# Patient Record
Sex: Female | Born: 2000 | Race: Black or African American | Hispanic: No | Marital: Single | Smoking: Never smoker
Health system: Southern US, Community
[De-identification: ages and names within clinical notes are randomized; demographics above are authoritative.]

## PROBLEM LIST (undated history)

## (undated) DIAGNOSIS — L509 Urticaria, unspecified: Secondary | ICD-10-CM

## (undated) DIAGNOSIS — S32009A Unspecified fracture of unspecified lumbar vertebra, initial encounter for closed fracture: Secondary | ICD-10-CM

## (undated) HISTORY — DX: Urticaria, unspecified: L50.9

## (undated) HISTORY — PX: INDUCED ABORTION: SHX677

---
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
## 2020-02-24 ENCOUNTER — Ambulatory Visit: Payer: Medicaid Other | Attending: Internal Medicine

## 2020-02-24 ENCOUNTER — Other Ambulatory Visit: Payer: Self-pay

## 2020-02-24 DIAGNOSIS — Z20822 Contact with and (suspected) exposure to covid-19: Secondary | ICD-10-CM

## 2020-02-25 LAB — SARS-COV-2, NAA 2 DAY TAT

## 2020-02-25 LAB — NOVEL CORONAVIRUS, NAA: SARS-CoV-2, NAA: NOT DETECTED

## 2020-02-26 ENCOUNTER — Other Ambulatory Visit: Payer: Self-pay

## 2020-02-26 ENCOUNTER — Ambulatory Visit
Admission: EM | Admit: 2020-02-26 | Discharge: 2020-02-26 | Disposition: A | Discharge status: Home / Self Care | Payer: Medicaid Other

## 2020-02-26 DIAGNOSIS — J302 Other seasonal allergic rhinitis: Secondary | ICD-10-CM | POA: Diagnosis not present

## 2020-02-26 LAB — POCT MONO SCREEN (KUC): Mono, POC: NEGATIVE

## 2020-02-26 MED ORDER — BENZONATATE 100 MG PO CAPS: 100.0000 mg | ORAL_CAPSULE | Freq: Three times a day (TID) | ORAL | 0 refills | Status: DC

## 2020-02-26 MED ORDER — FLUTICASONE PROPIONATE 50 MCG/ACT NA SUSP: 1.0000 | Freq: Every day | NASAL | 0 refills | Status: DC

## 2020-02-26 MED ORDER — CETIRIZINE HCL 10 MG PO TABS: 10.0000 mg | ORAL_TABLET | Freq: Every day | ORAL | 0 refills | Status: DC

## 2020-02-26 NOTE — ED Triage Notes (Signed)
Pt started having sore throat and nasal congestion 2 weeks ago. Strep throat has been completed and it was negative. COVID test 2 days ago negative.  Pt was put on amoxicillin, didn't realize she was suppose to take 2 a day.  Was only taking one a day.  Has about 2 days left on antibiotics. No fevers.

## 2020-02-26 NOTE — Discharge Instructions (Addendum)
Zyrtec, Flonase, and Tessalon Perles were prescribed Advised to continue to take and complete amoxicillin course Follow-up with PCP Return or go to ED for worsening of symptoms

## 2020-02-26 NOTE — ED Provider Notes (Signed)
[facility]    CSN: [id number] Arrival date & time: 02/26/20  1115      History   Chief Complaint Chief Complaint  Patient presents with  . Nasal Congestion    HPI Brianna Patel is a 19 y.o. female.   Who presented to the urgent care with a complaint of nasal congestion, and sore throat for the past 2 weeks.  Reports she went to another urgent care in [place] and was treated with amoxicillin and prednisone.  Reported she tested negative for COVID-19  2 days ago. Denies recent travel.  Denies aggravating or alleviating symptoms.  Denies previous COVID infection.   Denies fever, chills, fatigue, rhinorrhea, cough, SOB, wheezing, chest pain, nausea, vomiting, changes in bowel or bladder habits.    The history is provided by the patient. No language interpreter was used.    Past Medical History:  Diagnosis Date  . Closed lumbar vertebral fracture (HCC)     There are no problems to display for this patient.   No past surgical history on file.  OB History   No obstetric history on file.      Home Medications    Prior to Admission medications   Medication Sig Start Date End Date Taking? Authorizing Provider  amoxicillin (AMOXIL) 500 MG capsule Take 500 mg by mouth 2 (two) times daily. 02/10/20   [provider]  benzonatate (TESSALON) 100 MG capsule Take 1 capsule (100 mg total) by mouth every 8 (eight) hours. 02/26/20   Shalunda Lindh, Darrelyn Hillock, FNP  cetirizine (ZYRTEC ALLERGY) 10 MG tablet Take 1 tablet (10 mg total) by mouth daily. 02/26/20   Albin Duckett, Darrelyn Hillock, FNP  fluticasone (FLONASE) 50 MCG/ACT nasal spray Place 1 spray into both nostrils daily for 14 days. 02/26/20 03/11/20  Camauri Craton, Darrelyn Hillock, FNP  ibuprofen (ADVIL,MOTRIN) 600 MG tablet Take 1 tablet (600 mg total) every 6 (six) hours as needed by mouth. 09/25/17   Julianne Rice, MD    Family History No family history on file.  Social History Social History   Tobacco Use  . Smoking status:  Never Smoker  . Smokeless tobacco: Never Used  Substance Use Topics  . Alcohol use: No    Comment: occasional  . Drug use: Yes    Types: Marijuana     Allergies   Patient has no known allergies.   Review of Systems Review of Systems  Constitutional: Negative.   HENT: Positive for congestion and sore throat.   Respiratory: Negative.   Cardiovascular: Negative.   Gastrointestinal: Negative.   Neurological: Negative.   All other systems reviewed and are negative.    Physical Exam Triage Vital Signs ED Triage Vitals [02/26/20 1128]  Enc Vitals Group     BP 117/73     Pulse Rate 91     Resp 16     Temp 98.2 F (36.8 C)     Temp Source Oral     SpO2 96 %     Weight      Height      Head Circumference      Peak Flow      Pain Score      Pain Loc      Pain Edu?      Excl. in [place]?    No data found.  Updated Vital Signs BP 117/73 (BP Location: Right Arm)   Pulse 91   Temp 98.2 F (36.8 C) (Oral)   Resp 16   LMP 02/16/2020  SpO2 96%   Visual Acuity Right Eye Distance:   Left Eye Distance:   Bilateral Distance:    Right Eye Near:   Left Eye Near:    Bilateral Near:     Physical Exam Vitals and nursing note reviewed.  Constitutional:      General: She is not in acute distress.    Appearance: Normal appearance. She is normal weight. She is not ill-appearing, toxic-appearing or diaphoretic.  HENT:     Head: Normocephalic.     Right Ear: Tympanic membrane, ear canal and external ear normal. There is no impacted cerumen.     Left Ear: Tympanic membrane, ear canal and external ear normal. There is no impacted cerumen.     Nose: Congestion and rhinorrhea present.     Mouth/Throat:     Lips: Pink.     Mouth: Mucous membranes are moist.     Dentition: Normal dentition.     Tongue: No lesions.     Palate: No mass.     Pharynx: Oropharynx is clear.     Tonsils: 2+ on the right. 2+ on the left.  Cardiovascular:     Rate and Rhythm: Normal rate and regular  rhythm.     Pulses: Normal pulses.     Heart sounds: Normal heart sounds. No murmur. No friction rub. No gallop.   Pulmonary:     Effort: Pulmonary effort is normal. No respiratory distress.     Breath sounds: Normal breath sounds. No stridor. No wheezing, rhonchi or rales.  Chest:     Chest wall: No tenderness.  Neurological:     Mental Status: She is alert.      UC Treatments / Results  Labs (all labs ordered are listed, but only abnormal results are displayed) Labs Reviewed  POCT MONO SCREEN [facility])    EKG   Radiology No results found.  Procedures Procedures (including critical care time)  Medications Ordered in UC Medications - No data to display  Initial Impression / Assessment and Plan / UC Course  I have reviewed the triage vital signs and the nursing notes.  Pertinent labs & imaging results that were available during my care of the patient were reviewed by me and considered in my medical decision making (see chart for details).    Patient is stable at discharge. POCT mono was negative Patient was advised to continue to take and complete amoxicillin Zyrtec, Tessalon Perles, and Flonase were prescribed Was advised to follow-up with PCP Return or go to ED for worsening symptoms  Final Clinical Impressions(s) / UC Diagnoses   Final diagnoses:  Seasonal allergic reaction     Discharge Instructions     Zyrtec, Flonase, and Tessalon Perles were prescribed Advised to continue to take and complete amoxicillin course Follow-up with PCP Return or go to ED for worsening of symptoms    ED Prescriptions    Medication Sig Dispense Auth. Provider   cetirizine (ZYRTEC ALLERGY) 10 MG tablet Take 1 tablet (10 mg total) by mouth daily. 30 tablet Davanee Klinkner S, FNP   fluticasone (FLONASE) 50 MCG/ACT nasal spray Place 1 spray into both nostrils daily for 14 days. 16 g Justin Meisenheimer S, FNP   benzonatate (TESSALON) 100 MG capsule Take 1 capsule (100 mg total)  by mouth every 8 (eight) hours. 30 capsule Mailee Klaas, Zachery Dakins, FNP     PDMP not reviewed this encounter.   Durward Parcel, FNP 02/26/20 1220

## 2020-03-06 ENCOUNTER — Ambulatory Visit
Admission: EM | Admit: 2020-03-06 | Discharge: 2020-03-06 | Disposition: A | Discharge status: Home / Self Care | Payer: Medicaid Other | Attending: Emergency Medicine | Admitting: Emergency Medicine

## 2020-03-06 DIAGNOSIS — N898 Other specified noninflammatory disorders of vagina: Secondary | ICD-10-CM | POA: Diagnosis not present

## 2020-03-06 DIAGNOSIS — Z113 Encounter for screening for infections with a predominantly sexual mode of transmission: Secondary | ICD-10-CM | POA: Diagnosis not present

## 2020-03-06 MED ORDER — FLUCONAZOLE 150 MG PO TABS: 150.0000 mg | ORAL_TABLET | Freq: Every day | ORAL | 0 refills | Status: DC

## 2020-03-06 NOTE — ED Triage Notes (Signed)
Pt presents with c/o vaginal itching for past few days

## 2020-03-06 NOTE — ED Provider Notes (Signed)
[place]   [id number] 03/06/20 Arrival Time: 2423   NT:[id number] itching SUBJECTIVE:  [place] is a 19 y.o. female who presents to the urgent care with a complaint of vaginal itching for the past few days.  She denies a precipitating event, recent sexual encounter.  Has recently finished antibiotic course.  Patient is sexually active with 1 female partners.  Has not tried any OTC medication.  Denies similar symptoms in the past.  She denies fever, chills, nausea, vomiting, abdominal or pelvic pain, urinary symptoms, vaginal itching, vaginal odor, vaginal bleeding, dyspareunia, vaginal rashes or lesions.   Patient's last menstrual period was 02/16/2020. Current birth control method: Compliant with BC:  ROS: As per HPI.  All other pertinent ROS negative.     Past Medical History:  Diagnosis Date  . Closed lumbar vertebral fracture ([place])    History reviewed. No pertinent surgical history. No Known Allergies No current facility-administered medications on file prior to encounter.   Current Outpatient Medications on File Prior to Encounter  Medication Sig Dispense Refill  . amoxicillin (AMOXIL) 500 MG capsule Take 500 mg by mouth 2 (two) times daily.    . benzonatate (TESSALON) 100 MG capsule Take 1 capsule (100 mg total) by mouth every 8 (eight) hours. 30 capsule 0  . cetirizine (ZYRTEC ALLERGY) 10 MG tablet Take 1 tablet (10 mg total) by mouth daily. 30 tablet 0  . fluticasone (FLONASE) 50 MCG/ACT nasal spray Place 1 spray into both nostrils daily for 14 days. 16 g 0  . ibuprofen (ADVIL,MOTRIN) 600 MG tablet Take 1 tablet (600 mg total) every 6 (six) hours as needed by mouth. 30 tablet 0    Social History   Socioeconomic History  . Marital status: Single    Spouse name: Not on file  . Number of children: Not on file  . Years of education: Not on file  . Highest education level: Not on file  Occupational History  . Not on file  Tobacco Use  . Smoking status:  Never Smoker  . Smokeless tobacco: Never Used  Substance and Sexual Activity  . Alcohol use: No    Comment: occasional  . Drug use: Yes    Types: Marijuana  . Sexual activity: Yes    Birth control/protection: Implant  Other Topics Concern  . Not on file  Social History Narrative  . Not on file   Social Determinants of Health   Financial Resource Strain:   . Difficulty of Paying Living Expenses:   Food Insecurity:   . Worried About Charity fundraiser in the Last Year:   . Arboriculturist in the Last Year:   Transportation Needs:   . Film/video editor (Medical):   Marland Kitchen Lack of Transportation (Non-Medical):   Physical Activity:   . Days of Exercise per Week:   . Minutes of Exercise per Session:   Stress:   . Feeling of Stress :   Social Connections:   . Frequency of Communication with Friends and Family:   . Frequency of Social Gatherings with Friends and Family:   . Attends Religious Services:   . Active Member of Clubs or Organizations:   . Attends Archivist Meetings:   Marland Kitchen Marital Status:   Intimate Partner Violence:   . Fear of Current or Ex-Partner:   . Emotionally Abused:   Marland Kitchen Physically Abused:   . Sexually Abused:    History reviewed. No pertinent family history.  OBJECTIVE:  Vitals:  03/06/20 1600  BP: 135/87  Pulse: 89  Resp: 16  Temp: 98.2 F (36.8 C)  TempSrc: Oral  SpO2: 98%     General appearance: Alert, NAD, appears stated age Head: NCAT Throat: lips, mucosa, and tongue normal; teeth and gums normal Lungs: CTA bilaterally without adventitious breath sounds Heart: regular rate and rhythm.  Radial pulses 2+ symmetrical bilaterally Back: no CVA tenderness Abdomen: soft, non-tender; bowel sounds normal; no masses or organomegaly; no guarding or rebound tenderness GU:  Vaginal self swab obtained Skin: warm and dry Psychological:  Alert and cooperative. Normal mood and affect.  LABS:  Results for orders placed or performed during  the hospital encounter of 02/26/20  POCT mono screen  Result Value Ref Range   Mono, POC Negative Negative    Labs Reviewed  CERVICOVAGINAL ANCILLARY ONLY    ASSESSMENT & PLAN:  1. Vaginal itching   2. Screening for STD (sexually transmitted disease)     Meds ordered this encounter  Medications  . fluconazole (DIFLUCAN) 150 MG tablet    Sig: Take 1 tablet (150 mg total) by mouth daily. Prescribed diflucan 100 mg once daily and then second dose 72 hours late    Dispense:  2 tablet    Refill:  0    Pending: Labs Reviewed  CERVICOVAGINAL ANCILLARY ONLY    Vaginal self-swab obtained.  We will follow up with you regarding abnormal results Prescribed diflucan 150 mg once daily and then second dose 72 hours later Take medications as prescribed and to completion If tests results are positive, please abstain from sexual activity until you and your partner(s) have been treated Follow up with PCP or [facility] if symptoms persists Return here or go to ER if you have any new or worsening symptoms fever, chills, nausea, vomiting, abdominal or pelvic pain, painful intercourse, vaginal discharge, vaginal bleeding, persistent symptoms despite treatment, etc...  Reviewed expectations re: course of current medical issues. Questions answered. Outlined signs and symptoms indicating need for more acute intervention. Patient verbalized understanding. After Visit Summary given.       Durward Parcel, FNP 03/06/20 1701

## 2020-03-06 NOTE — Discharge Instructions (Addendum)
Vaginal self-swab obtained.  We will follow up with you regarding abnormal results Prescribed diflucan 150 mg once daily and then second dose 72 hours later Take medications as prescribed and to completion If tests results are positive, please abstain from sexual activity until you and your partner(s) have been treated Follow up with PCP or [facility] if symptoms persists Return here or go to ER if you have any new or worsening symptoms fever, chills, nausea, vomiting, abdominal or pelvic pain, painful intercourse, vaginal discharge, vaginal bleeding, persistent symptoms despite treatment, etc... 

## 2020-03-07 ENCOUNTER — Telehealth (HOSPITAL_COMMUNITY): Payer: Self-pay

## 2020-03-07 LAB — CERVICOVAGINAL ANCILLARY ONLY
Bacterial Vaginitis (gardnerella): POSITIVE — AB
Candida Glabrata: NEGATIVE
Candida Vaginitis: NEGATIVE
Chlamydia: NEGATIVE
Comment: NEGATIVE
Comment: NEGATIVE
Comment: NEGATIVE
Comment: NEGATIVE
Comment: NEGATIVE
Comment: NORMAL
Neisseria Gonorrhea: NEGATIVE
Trichomonas: NEGATIVE

## 2020-03-07 MED ORDER — METRONIDAZOLE 500 MG PO TABS: 500.0000 mg | ORAL_TABLET | Freq: Two times a day (BID) | ORAL | 0 refills | Status: DC

## 2020-03-20 ENCOUNTER — Other Ambulatory Visit: Payer: Self-pay

## 2020-03-20 ENCOUNTER — Ambulatory Visit: Payer: Medicaid Other | Attending: Internal Medicine

## 2020-03-20 DIAGNOSIS — Z20822 Contact with and (suspected) exposure to covid-19: Secondary | ICD-10-CM

## 2020-03-21 LAB — NOVEL CORONAVIRUS, NAA: SARS-CoV-2, NAA: NOT DETECTED

## 2020-03-21 LAB — SARS-COV-2, NAA 2 DAY TAT

## 2020-06-05 ENCOUNTER — Encounter: Payer: Self-pay | Admitting: Emergency Medicine

## 2020-06-05 ENCOUNTER — Other Ambulatory Visit: Payer: Self-pay

## 2020-06-05 ENCOUNTER — Ambulatory Visit
Admission: EM | Admit: 2020-06-05 | Discharge: 2020-06-05 | Disposition: A | Discharge status: Home / Self Care | Payer: Medicaid Other

## 2020-06-05 DIAGNOSIS — M545 Low back pain, unspecified: Secondary | ICD-10-CM

## 2020-06-05 MED ORDER — CYCLOBENZAPRINE HCL 10 MG PO TABS: 10.0000 mg | ORAL_TABLET | Freq: Every day | ORAL | 0 refills | Status: DC

## 2020-06-05 MED ORDER — PREDNISONE 20 MG PO TABS: 20.0000 mg | ORAL_TABLET | Freq: Two times a day (BID) | ORAL | 0 refills | Status: AC

## 2020-06-05 NOTE — ED Triage Notes (Signed)
Lower back pain for 3 days.

## 2020-06-05 NOTE — ED Provider Notes (Signed)
[facility]   [id number] 06/05/20 Arrival Time: 1814  CC: Back PAIN  SUBJECTIVE: History from: patient. Terease A Gowens is a 19 y.o. female complains of low back pain x 3 days.  Denies a precipitating event or specific injury.  Localizes the pain to the bilateral low back.  Describes the pain as constant and sharp in character.  Has tried OTC medications without relief.  Symptoms are made worse with laying flat.  Denies similar symptoms in the past.  Denies fever, chills, erythema, ecchymosis, effusion, weakness, numbness and tingling, saddle paresthesias, loss of bowel or bladder function.      ROS: As per HPI.  All other pertinent ROS negative.     Past Medical History:  Diagnosis Date  . Closed lumbar vertebral fracture ([facility])    History reviewed. No pertinent surgical history. No Known Allergies No current facility-administered medications on file prior to encounter.   Current Outpatient Medications on File Prior to Encounter  Medication Sig Dispense Refill  . cetirizine (ZYRTEC ALLERGY) 10 MG tablet Take 1 tablet (10 mg total) by mouth daily. 30 tablet 0  . CRYSELLE-28 0.3-30 MG-MCG tablet Take 1 tablet by mouth daily.    . fluticasone (FLONASE) 50 MCG/ACT nasal spray Place 1 spray into both nostrils daily for 14 days. 16 g 0  . ibuprofen (ADVIL,MOTRIN) 600 MG tablet Take 1 tablet (600 mg total) every 6 (six) hours as needed by mouth. 30 tablet 0   Social History   Socioeconomic History  . Marital status: Single    Spouse name: Not on file  . Number of children: Not on file  . Years of education: Not on file  . Highest education level: Not on file  Occupational History  . Not on file  Tobacco Use  . Smoking status: Never Smoker  . Smokeless tobacco: Never Used  Vaping Use  . Vaping Use: Never used  Substance and Sexual Activity  . Alcohol use: No    Comment: occasional  . Drug use: Yes    Types: Marijuana  . Sexual activity: Yes    Birth  control/protection: Implant  Other Topics Concern  . Not on file  Social History Narrative  . Not on file   Social Determinants of Health   Financial Resource Strain:   . Difficulty of Paying Living Expenses:   Food Insecurity:   . Worried About Programme researcher, broadcasting/film/video in the Last Year:   . Barista in the Last Year:   Transportation Needs:   . Freight forwarder (Medical):   Marland Kitchen Lack of Transportation (Non-Medical):   Physical Activity:   . Days of Exercise per Week:   . Minutes of Exercise per Session:   Stress:   . Feeling of Stress :   Social Connections:   . Frequency of Communication with Friends and Family:   . Frequency of Social Gatherings with Friends and Family:   . Attends Religious Services:   . Active Member of Clubs or Organizations:   . Attends Banker Meetings:   Marland Kitchen Marital Status:   Intimate Partner Violence:   . Fear of Current or Ex-Partner:   . Emotionally Abused:   Marland Kitchen Physically Abused:   . Sexually Abused:    No family history on file.  OBJECTIVE:  Vitals:   06/05/20 1826 06/05/20 1827  BP: 120/73   Pulse: 88   Resp: 18   Temp: 98.7 F (37.1 C)   TempSrc: Oral  SpO2: 98%   Weight:  180 lb (81.6 kg)    General appearance: ALERT; in no acute distress.  Head: NCAT Lungs: Normal respiratory effort; CTAB CV: RRR Musculoskeletal: Back Inspection: Skin warm, dry, clear and intact without obvious erythema, effusion, or ecchymosis.  Palpation: Diffusely TTP over lumbar spine and lumbar paravertebral muscles ROM: FROM active and passive Strength: 5/5 shld abduction, 5/5 shld adduction, 5/5 elbow flexion, 5/5 elbow extension, 5/5 grip strength, 5/5 hip flexion, 5/5 hip extension Skin: warm and dry Neurologic: Ambulates without difficulty; Sensation intact about the upper/ lower extremities Psychological: alert and cooperative; normal mood and affect  ASSESSMENT & PLAN:  1. Acute bilateral low back pain without sciatica      Meds ordered this encounter  Medications  . predniSONE (DELTASONE) 20 MG tablet    Sig: Take 1 tablet (20 mg total) by mouth 2 (two) times daily with a meal for 5 days.    Dispense:  10 tablet    Refill:  0    Order Specific Question:   Supervising Provider    Answer:   Eustace Moore [[id number]]  . cyclobenzaprine (FLEXERIL) 10 MG tablet    Sig: Take 1 tablet (10 mg total) by mouth at bedtime.    Dispense:  15 tablet    Refill:  0    Order Specific Question:   Supervising Provider    Answer:   Eustace Moore [[id number]]    Continue conservative management of rest, ice, and gentle stretches Prednisone prescribed.  Take as directed and to completion Take cyclobenzaprine at nighttime for symptomatic relief. Avoid driving or operating heavy machinery while using medication. Follow up with PCP if symptoms persist Return or go to the ER if you have any new or worsening symptoms (fever, chills, chest pain, abdominal pain, changes in bowel or bladder habits, pain radiating into lower legs, etc...)   Reviewed expectations re: course of current medical issues. Questions answered. Outlined signs and symptoms indicating need for more acute intervention. Patient verbalized understanding. After Visit Summary given.    Rennis Harding, PA-C 06/05/20 1843

## 2020-06-05 NOTE — Discharge Instructions (Signed)
Continue conservative management of rest, ice, and gentle stretches Prednisone prescribed.  Take as directed and to completion Take cyclobenzaprine at nighttime for symptomatic relief. Avoid driving or operating heavy machinery while using medication. Follow up with PCP if symptoms persist Return or go to the ER if you have any new or worsening symptoms (fever, chills, chest pain, abdominal pain, changes in bowel or bladder habits, pain radiating into lower legs, etc...)  

## 2020-06-09 ENCOUNTER — Encounter: Payer: Self-pay | Admitting: Emergency Medicine

## 2020-06-09 ENCOUNTER — Ambulatory Visit
Admission: EM | Admit: 2020-06-09 | Discharge: 2020-06-09 | Disposition: A | Discharge status: Home / Self Care | Payer: Medicaid Other | Attending: Emergency Medicine | Admitting: Emergency Medicine

## 2020-06-09 DIAGNOSIS — Z20822 Contact with and (suspected) exposure to covid-19: Secondary | ICD-10-CM

## 2020-06-09 NOTE — ED Triage Notes (Signed)
covid exposure from a family member no symptoms

## 2020-06-09 NOTE — Discharge Instructions (Signed)

## 2020-06-09 NOTE — ED Provider Notes (Signed)
[facility]   [id number] 06/09/20 Arrival Time: 1235   CC: COVID exposure  SUBJECTIVE: History from: patient.  Oliver A Goga is a 19 y.o. female who presents for COVID testing.  COVID exposure today.  Denies recent travel.  Denies aggravating or alleviating symptoms.  Reports previous COVID infection in May.   Denies fever, chills, fatigue, nasal congestion, rhinorrhea, sore throat, cough, SOB, wheezing, chest pain, nausea, vomiting, changes in bowel or bladder habits.     ROS: As per HPI.  All other pertinent ROS negative.     Past Medical History:  Diagnosis Date  . Closed lumbar vertebral fracture ([facility])    History reviewed. No pertinent surgical history. No Known Allergies No current facility-administered medications on file prior to encounter.   Current Outpatient Medications on File Prior to Encounter  Medication Sig Dispense Refill  . cetirizine (ZYRTEC ALLERGY) 10 MG tablet Take 1 tablet (10 mg total) by mouth daily. 30 tablet 0  . CRYSELLE-28 0.3-30 MG-MCG tablet Take 1 tablet by mouth daily.    . cyclobenzaprine (FLEXERIL) 10 MG tablet Take 1 tablet (10 mg total) by mouth at bedtime. 15 tablet 0  . fluticasone (FLONASE) 50 MCG/ACT nasal spray Place 1 spray into both nostrils daily for 14 days. 16 g 0  . ibuprofen (ADVIL,MOTRIN) 600 MG tablet Take 1 tablet (600 mg total) every 6 (six) hours as needed by mouth. 30 tablet 0  . predniSONE (DELTASONE) 20 MG tablet Take 1 tablet (20 mg total) by mouth 2 (two) times daily with a meal for 5 days. 10 tablet 0   Social History   Socioeconomic History  . Marital status: Single    Spouse name: Not on file  . Number of children: Not on file  . Years of education: Not on file  . Highest education level: Not on file  Occupational History  . Not on file  Tobacco Use  . Smoking status: Never Smoker  . Smokeless tobacco: Never Used  Vaping Use  . Vaping Use: Never used  Substance and Sexual Activity  . Alcohol use:  No    Comment: occasional  . Drug use: Yes    Types: Marijuana  . Sexual activity: Yes    Birth control/protection: Implant  Other Topics Concern  . Not on file  Social History Narrative  . Not on file   Social Determinants of Health   Financial Resource Strain:   . Difficulty of Paying Living Expenses:   Food Insecurity:   . Worried About Programme researcher, broadcasting/film/video in the Last Year:   . Barista in the Last Year:   Transportation Needs:   . Freight forwarder (Medical):   Marland Kitchen Lack of Transportation (Non-Medical):   Physical Activity:   . Days of Exercise per Week:   . Minutes of Exercise per Session:   Stress:   . Feeling of Stress :   Social Connections:   . Frequency of Communication with Friends and Family:   . Frequency of Social Gatherings with Friends and Family:   . Attends Religious Services:   . Active Member of Clubs or Organizations:   . Attends Banker Meetings:   Marland Kitchen Marital Status:   Intimate Partner Violence:   . Fear of Current or Ex-Partner:   . Emotionally Abused:   Marland Kitchen Physically Abused:   . Sexually Abused:    History reviewed. No pertinent family history.  OBJECTIVE:  Vitals:   06/09/20 1246  BP:  103/66  Pulse: 75  Resp: 16  Temp: 98.2 F (36.8 C)  TempSrc: Oral  SpO2: 97%     General appearance: alert; well-appearing, nontoxic; speaking in full sentences and tolerating own secretions HEENT: NCAT; Ears: EACs clear, TMs pearly gray; Eyes: PERRL.  EOM grossly intact.Nose: nares patent without rhinorrhea, Throat: oropharynx clear, tonsils non erythematous or enlarged, uvula midline  Neck: supple without LAD Lungs: unlabored respirations, symmetrical air entry; cough: absent; no respiratory distress; CTAB Heart: regular rate and rhythm.  Skin: warm and dry Psychological: alert and cooperative; normal mood and affect  ASSESSMENT & PLAN:  1. Exposure to COVID-19 virus    COVID testing ordered.  It will take between 2-5 days  for test results.  Someone will contact you regarding abnormal results.    In the meantime:  If you were to develop symptoms: You should remain isolated in your home for 10 days from symptom onset AND greater than 72 hours after symptoms resolution (absence of fever without the use of fever-reducing medication and improvement in respiratory symptoms), whichever is longer If you have had exposure: you should remain in quarantine for 7 days from exposure.  However, if symptoms develop you must self isolate and return for retesting Get plenty of rest and push fluids Follow up with PCP as needed Call or go to the ED if you have any new symptoms such as fever, cough, shortness of breath, chest tightness, chest pain, turning blue, changes in mental status, etc...   Reviewed expectations re: course of current medical issues. Questions answered. Outlined signs and symptoms indicating need for more acute intervention. Patient verbalized understanding. After Visit Summary given.         Rennis Harding, PA-C 06/09/20 1258

## 2020-06-10 LAB — NOVEL CORONAVIRUS, NAA: SARS-CoV-2, NAA: NOT DETECTED

## 2020-06-10 LAB — SARS-COV-2, NAA 2 DAY TAT

## 2020-10-13 ENCOUNTER — Ambulatory Visit
Admission: RE | Admit: 2020-10-13 | Discharge: 2020-10-13 | Disposition: A | Discharge status: Home / Self Care | Payer: Medicaid Other | Source: Ambulatory Visit | Attending: Family Medicine | Admitting: Family Medicine

## 2020-10-13 ENCOUNTER — Other Ambulatory Visit: Payer: Self-pay

## 2020-10-13 VITALS — BP 110/69 | HR 84 | Temp 98.6°F | Resp 18 | Ht 63.0 in | Wt 182.0 lb

## 2020-10-13 DIAGNOSIS — Z113 Encounter for screening for infections with a predominantly sexual mode of transmission: Secondary | ICD-10-CM | POA: Diagnosis present

## 2020-10-13 DIAGNOSIS — N898 Other specified noninflammatory disorders of vagina: Secondary | ICD-10-CM | POA: Insufficient documentation

## 2020-10-13 NOTE — Discharge Instructions (Addendum)
Swab test will be back in about 3 days  Results will be available MyChart.  We will be in touch with you with abnormal results that require further treatment.  Abstain from sex for the next 7 days or until results are back and negative.  Follow up with this office or with primary care if symptoms are persisting.  Follow up in the ER for high fever, trouble swallowing, trouble breathing, other concerning symptoms.

## 2020-10-13 NOTE — ED Triage Notes (Addendum)
Pt had some brown vaginal discharge x 2days ago.  Pt is not supposed to start her cycle for another week.  Pt would like to be tested for std's.

## 2020-10-13 NOTE — ED Provider Notes (Signed)
[facility]   [id number] 10/13/20 Arrival Time: 1649   CC: VAGINAL DISCHARGE  SUBJECTIVE:  Brianna Patel is a 19 y.o. female who presents with complaints of abrupt vaginal discharge that began last week. Reports that it only lasted for 1 day. Reports that she is currently sexually active with female. Takes her birth control consistently. Declines pregnancy test today. Describes discharge as brown. Has not attempted OTC treatment.  Denies aggravating or alleviating factors. Denies similar symptoms in the past.  Denies fever, chills, nausea, vomiting, abdominal or pelvic pain, urinary symptoms, vaginal itching, vaginal odor, vaginal bleeding, dyspareunia, vaginal rashes or lesions.   Patient's last menstrual period was 09/24/2020. Current birth control method: pills  Compliant with BC: yes  ROS: As per HPI.  All other pertinent ROS negative.     Past Medical History:  Diagnosis Date  . Closed lumbar vertebral fracture ([facility])    History reviewed. No pertinent surgical history. No Known Allergies No current facility-administered medications on file prior to encounter.   Current Outpatient Medications on File Prior to Encounter  Medication Sig Dispense Refill  . cetirizine (ZYRTEC ALLERGY) 10 MG tablet Take 1 tablet (10 mg total) by mouth daily. 30 tablet 0  . CRYSELLE-28 0.3-30 MG-MCG tablet Take 1 tablet by mouth daily.    . cyclobenzaprine (FLEXERIL) 10 MG tablet Take 1 tablet (10 mg total) by mouth at bedtime. 15 tablet 0  . fluticasone (FLONASE) 50 MCG/ACT nasal spray Place 1 spray into both nostrils daily for 14 days. 16 g 0  . ibuprofen (ADVIL,MOTRIN) 600 MG tablet Take 1 tablet (600 mg total) every 6 (six) hours as needed by mouth. 30 tablet 0    Social History   Socioeconomic History  . Marital status: Single    Spouse name: Not on file  . Number of children: Not on file  . Years of education: Not on file  . Highest education level: Not on file  Occupational  History  . Not on file  Tobacco Use  . Smoking status: Never Smoker  . Smokeless tobacco: Never Used  Vaping Use  . Vaping Use: Never used  Substance and Sexual Activity  . Alcohol use: No    Comment: occasional  . Drug use: Not Currently    Types: Marijuana  . Sexual activity: Yes    Birth control/protection: Implant  Other Topics Concern  . Not on file  Social History Narrative  . Not on file   Social Determinants of Health   Financial Resource Strain:   . Difficulty of Paying Living Expenses: Not on file  Food Insecurity:   . Worried About Programme researcher, broadcasting/film/video in the Last Year: Not on file  . Ran Out of Food in the Last Year: Not on file  Transportation Needs:   . Lack of Transportation (Medical): Not on file  . Lack of Transportation (Non-Medical): Not on file  Physical Activity:   . Days of Exercise per Week: Not on file  . Minutes of Exercise per Session: Not on file  Stress:   . Feeling of Stress : Not on file  Social Connections:   . Frequency of Communication with Friends and Family: Not on file  . Frequency of Social Gatherings with Friends and Family: Not on file  . Attends Religious Services: Not on file  . Active Member of Clubs or Organizations: Not on file  . Attends Banker Meetings: Not on file  . Marital Status: Not on file  Intimate Partner Violence:   . Fear of Current or Ex-Partner: Not on file  . Emotionally Abused: Not on file  . Physically Abused: Not on file  . Sexually Abused: Not on file   No family history on file.  OBJECTIVE:  Vitals:   10/13/20 1721 10/13/20 1722  BP: 110/69   Pulse: 84   Resp: 18   Temp: 98.6 F (37 C)   TempSrc: Oral   SpO2: 98%   Weight:  182 lb (82.6 kg)  Height:  5\' 3"  (1.6 m)     General appearance: Alert, NAD, appears stated age Head: NCAT Throat: lips, mucosa, and tongue normal; teeth and gums normal Lungs: CTA bilaterally without adventitious breath sounds Heart: regular rate and  rhythm.  Radial pulses 2+ symmetrical bilaterally Back: no CVA tenderness Abdomen: soft, non-tender; bowel sounds normal; no masses or organomegaly; no guarding or rebound tenderness GU: declines  Skin: warm and dry Psychological:  Alert and cooperative. Normal mood and affect.  LABS:  Results for orders placed or performed during the hospital encounter of 06/09/20  Novel Coronavirus, NAA ([facility])   Specimen: Nasopharyngeal(NP) swabs in vial transport medium   Nasopharynge  Patient  Result Value Ref Range   SARS-CoV-2, NAA Not Detected Not Detected  SARS-COV-2, NAA 2 DAY TAT   Nasopharynge  Patient  Result Value Ref Range   SARS-CoV-2, NAA 2 DAY TAT Performed     Labs Reviewed  CERVICOVAGINAL ANCILLARY ONLY    ASSESSMENT & PLAN:  1. Screening for STD (sexually transmitted disease)   2. Vaginal discharge     No orders of the defined types were placed in this encounter.   Pending: Labs Reviewed  CERVICOVAGINAL ANCILLARY ONLY    Cytology self-swab obtained.   We will follow up with you regarding abnormal results If tests results are positive, please abstain from sexual activity until you and your partner(s) have been treated Follow up with PCP or [facility] if symptoms persists Return here or go to ER if you have any new or worsening symptoms fever, chills, nausea, vomiting, abdominal or pelvic pain, painful intercourse, persistent symptoms despite treatment Reviewed expectations re: course of current medical issues. Questions answered. Outlined signs and symptoms indicating need for more acute intervention. Patient verbalized understanding. After Visit Summary given.       06/11/20, NP 10/13/20 1745

## 2020-10-16 LAB — CERVICOVAGINAL ANCILLARY ONLY
Bacterial Vaginitis (gardnerella): NEGATIVE
Candida Glabrata: NEGATIVE
Candida Vaginitis: NEGATIVE
Chlamydia: NEGATIVE
Comment: NEGATIVE
Comment: NEGATIVE
Comment: NEGATIVE
Comment: NEGATIVE
Comment: NEGATIVE
Comment: NORMAL
Neisseria Gonorrhea: NEGATIVE
Trichomonas: NEGATIVE

## 2020-11-02 ENCOUNTER — Ambulatory Visit
Admission: EM | Admit: 2020-11-02 | Discharge: 2020-11-02 | Disposition: A | Discharge status: Home / Self Care | Payer: Medicaid Other | Attending: Internal Medicine | Admitting: Internal Medicine

## 2020-11-02 ENCOUNTER — Other Ambulatory Visit: Payer: Self-pay

## 2020-11-02 DIAGNOSIS — J029 Acute pharyngitis, unspecified: Secondary | ICD-10-CM | POA: Diagnosis present

## 2020-11-02 LAB — POCT RAPID STREP A (OFFICE): Rapid Strep A Screen: NEGATIVE

## 2020-11-02 NOTE — ED Triage Notes (Signed)
Pt presents with c/o nasal congestion for past couple of weeks, vomited yesterday

## 2020-11-02 NOTE — Discharge Instructions (Signed)
Warm salt water gargle Continue the Flonase and allergy medications given by your primary care doctor If you have worsening symptoms follow-up with primary care physician.

## 2020-11-02 NOTE — ED Provider Notes (Signed)
[facility]    CSN: [id number] Arrival date & time: 11/02/20  1611      History   Chief Complaint Chief Complaint  Patient presents with  . Nasal Congestion    HPI Brianna Patel is a 19 y.o. female comes to the urgent care with a 2-week history of nasal congestion, sore throat and an episode of emesis yesterday.  Patient denies any fever or chills.  No body aches.  No diarrhea.  No dizziness.  Patient is not vaccinated against COVID-19 virus.  She tested negative for COVID-19 earlier this week.   Patient was started on Flonase and Zyrtec earlier in the week.  She has been taking that consistently.  HPI  Past Medical History:  Diagnosis Date  . Closed lumbar vertebral fracture (HCC)     There are no problems to display for this patient.   History reviewed. No pertinent surgical history.  OB History   No obstetric history on file.      Home Medications    Prior to Admission medications   Medication Sig Start Date End Date Taking? Authorizing Provider  cetirizine (ZYRTEC ALLERGY) 10 MG tablet Take 1 tablet (10 mg total) by mouth daily. 02/26/20   Avegno, Zachery Dakins, FNP  CRYSELLE-28 0.3-30 MG-MCG tablet Take 1 tablet by mouth daily. 04/04/20   [provider]  cyclobenzaprine (FLEXERIL) 10 MG tablet Take 1 tablet (10 mg total) by mouth at bedtime. 06/05/20   Wurst, [place], PA-C  fluticasone (FLONASE) 50 MCG/ACT nasal spray Place 1 spray into both nostrils daily for 14 days. 02/26/20 03/11/20  Avegno, Zachery Dakins, FNP  ibuprofen (ADVIL,MOTRIN) 600 MG tablet Take 1 tablet (600 mg total) every 6 (six) hours as needed by mouth. 09/25/17   Loren Racer, MD    Family History History reviewed. No pertinent family history.  Social History Social History   Tobacco Use  . Smoking status: Never Smoker  . Smokeless tobacco: Never Used  Vaping Use  . Vaping Use: Never used  Substance Use Topics  . Alcohol use: No    Comment: occasional  . Drug use:  Not Currently    Types: Marijuana     Allergies   Patient has no known allergies.   Review of Systems Review of Systems  Eyes: Negative.   Respiratory: Negative.   Genitourinary: Negative.   Neurological: Negative.      Physical Exam Triage Vital Signs ED Triage Vitals  Enc Vitals Group     BP 11/02/20 1706 106/61     Pulse Rate 11/02/20 1706 84     Resp 11/02/20 1706 16     Temp 11/02/20 1706 97.6 F (36.4 C)     Temp Source 11/02/20 1706 Tympanic     SpO2 11/02/20 1706 96 %     Weight --      Height --      Head Circumference --      Peak Flow --      Pain Score 11/02/20 1718 0     Pain Loc --      Pain Edu? --      Excl. in GC? --    No data found.  Updated Vital Signs BP 106/61 (BP Location: Right Arm)   Pulse 84   Temp 97.6 F (36.4 C) (Tympanic)   Resp 16   LMP  (LMP Unknown)   SpO2 96%   Visual Acuity Right Eye Distance:   Left Eye Distance:   Bilateral Distance:  Right Eye Near:   Left Eye Near:    Bilateral Near:     Physical Exam HENT:     Right Ear: Tympanic membrane normal.     Left Ear: Tympanic membrane normal.     Nose: Nose normal.     Mouth/Throat:     Mouth: Mucous membranes are moist.     Pharynx: No posterior oropharyngeal erythema.  Neck:     Comments: Tonsils are 2+ bilaterally with no exudates. Musculoskeletal:     Cervical back: Normal range of motion. No rigidity or tenderness.  Lymphadenopathy:     Cervical: No cervical adenopathy.      UC Treatments / Results  Labs (all labs ordered are listed, but only abnormal results are displayed) Labs Reviewed  CULTURE, GROUP A STREP [facility])  POCT RAPID STREP A (OFFICE)    EKG   Radiology No results found.  Procedures Procedures (including critical care time)  Medications Ordered in UC Medications - No data to display  Initial Impression / Assessment and Plan / UC Course  I have reviewed the triage vital signs and the nursing notes.  Pertinent labs &  imaging results that were available during my care of the patient were reviewed by me and considered in my medical decision making (see chart for details).     1.  Allergic pharyngitis: Point-of-care strep is negative Throat culture sent Warm salt water gargle Continue taking cetirizine and Flonase I encourage you to consider taking the COVID-19 vaccine Final Clinical Impressions(s) / UC Diagnoses   Final diagnoses:  Allergic pharyngitis     Discharge Instructions     Warm salt water gargle Continue the Flonase and allergy medications given by your primary care doctor If you have worsening symptoms follow-up with primary care physician.   ED Prescriptions    None     PDMP not reviewed this encounter.   Merrilee Jansky, MD 11/02/20 1754

## 2020-11-05 LAB — CULTURE, GROUP A STREP (THRC)

## 2020-11-25 ENCOUNTER — Other Ambulatory Visit: Payer: Self-pay

## 2020-11-25 ENCOUNTER — Other Ambulatory Visit: Payer: Medicaid Other

## 2020-11-25 DIAGNOSIS — Z20822 Contact with and (suspected) exposure to covid-19: Secondary | ICD-10-CM

## 2020-11-30 LAB — SPECIMEN STATUS REPORT

## 2020-11-30 LAB — NOVEL CORONAVIRUS, NAA: SARS-CoV-2, NAA: NOT DETECTED

## 2021-03-04 ENCOUNTER — Other Ambulatory Visit: Payer: Self-pay

## 2021-03-04 ENCOUNTER — Emergency Department (HOSPITAL_COMMUNITY): Payer: Medicaid Other

## 2021-03-04 ENCOUNTER — Encounter (HOSPITAL_COMMUNITY): Payer: Self-pay | Admitting: Emergency Medicine

## 2021-03-04 ENCOUNTER — Emergency Department (HOSPITAL_COMMUNITY)
Admission: EM | Admit: 2021-03-04 | Discharge: 2021-03-04 | Disposition: A | Discharge status: Home / Self Care | Payer: Medicaid Other | Attending: Emergency Medicine | Admitting: Emergency Medicine

## 2021-03-04 DIAGNOSIS — S62346A Nondisplaced fracture of base of fifth metacarpal bone, right hand, initial encounter for closed fracture: Secondary | ICD-10-CM | POA: Diagnosis not present

## 2021-03-04 DIAGNOSIS — R52 Pain, unspecified: Secondary | ICD-10-CM

## 2021-03-04 DIAGNOSIS — W2209XA Striking against other stationary object, initial encounter: Secondary | ICD-10-CM | POA: Insufficient documentation

## 2021-03-04 DIAGNOSIS — Y9389 Activity, other specified: Secondary | ICD-10-CM | POA: Diagnosis not present

## 2021-03-04 DIAGNOSIS — S6991XA Unspecified injury of right wrist, hand and finger(s), initial encounter: Secondary | ICD-10-CM | POA: Diagnosis present

## 2021-03-04 LAB — I-STAT BETA HCG BLOOD, ED (MC, WL, AP ONLY): I-stat hCG, quantitative: <5 m[IU]/mL (ref <5)

## 2021-03-04 MED ORDER — ONDANSETRON 4 MG PO TBDP
4.0000 mg | ORAL_TABLET | Freq: Once | ORAL | Status: AC
  Administered 2021-03-04: 4 mg via ORAL
  Filled 2021-03-04: qty 1

## 2021-03-04 MED ORDER — OXYCODONE HCL 5 MG PO TABS
5.0000 mg | ORAL_TABLET | Freq: Once | ORAL | Status: AC
  Administered 2021-03-04: 5 mg via ORAL
  Filled 2021-03-04: qty 1

## 2021-03-04 MED ORDER — OXYCODONE HCL 5 MG PO TABS: 5.0000 mg | ORAL_TABLET | Freq: Four times a day (QID) | ORAL | 0 refills | Status: DC | PRN

## 2021-03-04 NOTE — ED Notes (Signed)
Ortho tech paged at this time

## 2021-03-04 NOTE — Discharge Instructions (Signed)
Take 1000 mg of Tylenol every 6 hours as needed for pain.  Take 600 mg of Motrin as needed every 8 hours for pain.  Take Roxicodone for breakthrough pain.

## 2021-03-04 NOTE — ED Notes (Signed)
Ortho tech at bedside 

## 2021-03-04 NOTE — Progress Notes (Signed)
Orthopedic Tech Progress Note Patient Details:  Brianna Patel [birth year] [id number]  Ortho Devices Type of Ortho Device: Ulna gutter splint Ortho Device/Splint Location: rue Ortho Device/Splint Interventions: Ordered,Application,Adjustment   Post Interventions Patient Tolerated: Well Instructions Provided: Care of device,Adjustment of device   Trinna Post 03/04/2021, 11:18 PM

## 2021-03-04 NOTE — ED Notes (Signed)
Patient given discharge paperwork and prescriptions. Verbalized understanding of teaching. No IV access. Ambulatory to exit in NAD with steady gait.
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
## 2021-04-28 ENCOUNTER — Ambulatory Visit
Admission: RE | Admit: 2021-04-28 | Discharge: 2021-04-28 | Disposition: A | Discharge status: Home / Self Care | Payer: Medicaid Other | Source: Ambulatory Visit | Attending: Family Medicine | Admitting: Family Medicine

## 2021-04-28 ENCOUNTER — Other Ambulatory Visit: Payer: Self-pay

## 2021-04-28 VITALS — BP 128/78 | HR 92 | Temp 99.3°F | Resp 16

## 2021-04-28 DIAGNOSIS — N760 Acute vaginitis: Secondary | ICD-10-CM | POA: Insufficient documentation

## 2021-04-28 DIAGNOSIS — M5431 Sciatica, right side: Secondary | ICD-10-CM | POA: Diagnosis present

## 2021-04-28 MED ORDER — PREDNISONE 20 MG PO TABS: 40.0000 mg | ORAL_TABLET | Freq: Every day | ORAL | 0 refills | Status: AC

## 2021-04-28 NOTE — ED Triage Notes (Signed)
Vaginal discharge that is thick and sometimes watery x 2 days.  States she is having upper right leg pain x 3 month

## 2021-04-28 NOTE — ED Provider Notes (Signed)
[facility]    CSN: [id number] Arrival date & time: 04/28/21  1259      History   Chief Complaint No chief complaint on file.   HPI Brianna Patel is a 20 y.o. female.   Reports shooting pain from right low back, down the right hip to right thigh. Has not attempted OTC treatment. Denies previous symptoms. Also reports whitish watery vaginal discharge for the last few days. Denies previous symptoms. Denies known STD exposure. Denies abdominal pain, dysuria, frequency, urgency, hematuria, dyspareunia, abnormal vaginal bleeding, other symptoms.  ROS per HPI  The history is provided by the patient.   Past Medical History:  Diagnosis Date   Closed lumbar vertebral fracture (HCC)     There are no problems to display for this patient.   History reviewed. No pertinent surgical history.  OB History   No obstetric history on file.      Home Medications    Prior to Admission medications   Medication Sig Start Date End Date Taking? Authorizing Provider  predniSONE (DELTASONE) 20 MG tablet Take 2 tablets (40 mg total) by mouth daily with breakfast for 5 days. 04/28/21 05/03/21 Yes Moshe Cipro, NP  cetirizine (ZYRTEC ALLERGY) 10 MG tablet Take 1 tablet (10 mg total) by mouth daily. 02/26/20   Avegno, Zachery Dakins, FNP  CRYSELLE-28 0.3-30 MG-MCG tablet Take 1 tablet by mouth daily. 04/04/20   [provider]  cyclobenzaprine (FLEXERIL) 10 MG tablet Take 1 tablet (10 mg total) by mouth at bedtime. 06/05/20   Wurst, [place], PA-C  fluticasone (FLONASE) 50 MCG/ACT nasal spray Place 1 spray into both nostrils daily for 14 days. 02/26/20 03/11/20  Avegno, Zachery Dakins, FNP  ibuprofen (ADVIL,MOTRIN) 600 MG tablet Take 1 tablet (600 mg total) every 6 (six) hours as needed by mouth. 09/25/17   Loren Racer, MD  oxyCODONE (ROXICODONE) 5 MG immediate release tablet Take 1 tablet (5 mg total) by mouth every 6 (six) hours as needed for up to 10 doses for severe pain.  03/04/21   Virgina Norfolk, DO    Family History History reviewed. No pertinent family history.  Social History Social History   Tobacco Use   Smoking status: Never   Smokeless tobacco: Never  Vaping Use   Vaping Use: Never used  Substance Use Topics   Alcohol use: No    Comment: occasional   Drug use: Not Currently    Types: Marijuana     Allergies   Patient has no known allergies.   Review of Systems Review of Systems   Physical Exam Triage Vital Signs ED Triage Vitals  Enc Vitals Group     BP 04/28/21 1339 128/78     Pulse Rate 04/28/21 1339 92     Resp 04/28/21 1339 16     Temp 04/28/21 1339 99.3 F (37.4 C)     Temp Source 04/28/21 1339 Oral     SpO2 04/28/21 1339 98 %     Weight --      Height --      Head Circumference --      Peak Flow --      Pain Score 04/28/21 1341 6     Pain Loc --      Pain Edu? --      Excl. in GC? --    No data found.  Updated Vital Signs BP 128/78 (BP Location: Right Arm)   Pulse 92   Temp 99.3 F (37.4 C) (Oral)   Resp  16   LMP 04/02/2021   SpO2 98%   Visual Acuity Right Eye Distance:   Left Eye Distance:   Bilateral Distance:    Right Eye Near:   Left Eye Near:    Bilateral Near:     Physical Exam Vitals and nursing note reviewed.  Constitutional:      General: She is not in acute distress.    Appearance: Normal appearance. She is well-developed.  HENT:     Head: Normocephalic and atraumatic.     Nose: Nose normal.     Mouth/Throat:     Mouth: Mucous membranes are moist.     Pharynx: Oropharynx is clear.  Eyes:     Extraocular Movements: Extraocular movements intact.     Conjunctiva/sclera: Conjunctivae normal.     Pupils: Pupils are equal, round, and reactive to light.  Cardiovascular:     Rate and Rhythm: Normal rate and regular rhythm.  Pulmonary:     Effort: Pulmonary effort is normal. No respiratory distress.  Genitourinary:    Comments: Deferred  Musculoskeletal:        General:  Tenderness present. Normal range of motion.     Cervical back: Normal range of motion and neck supple.     Comments: Right low back tenderness, + right SLR  Skin:    General: Skin is warm and dry.     Capillary Refill: Capillary refill takes less than 2 seconds.  Neurological:     General: No focal deficit present.     Mental Status: She is alert and oriented to person, place, and time.  Psychiatric:        Mood and Affect: Mood normal.        Behavior: Behavior normal.        Thought Content: Thought content normal.     UC Treatments / Results  Labs (all labs ordered are listed, but only abnormal results are displayed) Labs Reviewed  CERVICOVAGINAL ANCILLARY ONLY    EKG   Radiology No results found.  Procedures Procedures (including critical care time)  Medications Ordered in UC Medications - No data to display  Initial Impression / Assessment and Plan / UC Course  I have reviewed the triage vital signs and the nursing notes.  Pertinent labs & imaging results that were available during my care of the patient were reviewed by me and considered in my medical decision making (see chart for details).    Acute vaginitis Right Sided Sciatica  Prescribed prednisone 40mg  daily x 5 days for sciatica Self cytology swab obtained Will be in contact with any abnormal results that require further treatment Do not have sex until results are back and negative, or until treatment is complete, whichever is longer Follow up with this office or with primary care if symptoms are persisting.  Follow up in the ER for high fever, trouble swallowing, trouble breathing, other concerning symptoms.   Final Clinical Impressions(s) / UC Diagnoses   Final diagnoses:  Right sided sciatica  Acute vaginitis     Discharge Instructions      I have sent in prednisone for you to take 2 tablets every morning for 5 days  Your vaginal tests are pending. If your test results are positive, we  will call you. You and your sexual partner(s) may require treatment at that time. Do not have sexual activity for at least 7 days.           ED Prescriptions     Medication Sig Dispense  Auth. Provider   predniSONE (DELTASONE) 20 MG tablet Take 2 tablets (40 mg total) by mouth daily with breakfast for 5 days. 10 tablet Moshe Cipro, NP      PDMP not reviewed this encounter.   Moshe Cipro, NP 04/28/21 1402

## 2021-04-28 NOTE — Discharge Instructions (Addendum)
I have sent in prednisone for you to take 2 tablets every morning for 5 days  Your vaginal tests are pending. If your test results are positive, we will call you. You and your sexual partner(s) may require treatment at that time. Do not have sexual activity for at least 7 days.

## 2021-04-30 LAB — CERVICOVAGINAL ANCILLARY ONLY
Bacterial Vaginitis (gardnerella): NEGATIVE
Candida Glabrata: NEGATIVE
Candida Vaginitis: NEGATIVE
Chlamydia: NEGATIVE
Comment: NEGATIVE
Comment: NEGATIVE
Comment: NEGATIVE
Comment: NEGATIVE
Comment: NEGATIVE
Comment: NORMAL
Neisseria Gonorrhea: NEGATIVE
Trichomonas: NEGATIVE

## 2021-07-17 ENCOUNTER — Ambulatory Visit: Payer: Medicaid Other

## 2021-07-18 ENCOUNTER — Other Ambulatory Visit: Payer: Self-pay

## 2021-07-18 ENCOUNTER — Ambulatory Visit
Admission: RE | Admit: 2021-07-18 | Discharge: 2021-07-18 | Disposition: A | Discharge status: Home / Self Care | Payer: Medicaid Other | Source: Ambulatory Visit | Attending: Family Medicine | Admitting: Family Medicine

## 2021-07-18 VITALS — BP 103/67 | HR 70 | Temp 98.1°F | Resp 18

## 2021-07-18 DIAGNOSIS — N898 Other specified noninflammatory disorders of vagina: Secondary | ICD-10-CM | POA: Insufficient documentation

## 2021-07-18 NOTE — ED Triage Notes (Signed)
Pt presents with c/o white odorous discharge that began 2 days ago

## 2021-07-18 NOTE — Discharge Instructions (Addendum)
We have sent testing for causes of vaginal infections. We will notify you of any positive results once they are received. If required, we will prescribe any medications you might need.  Please refrain from all sexual activity for at least the next seven days.  

## 2021-07-18 NOTE — ED Provider Notes (Signed)
  [facility]   [id number] 07/18/21 Arrival Time: 1201  ASSESSMENT & PLAN:  1. Vaginal discharge       Discharge Instructions      We have sent testing for causes of vaginal infections. We will notify you of any positive results once they are received. If required, we will prescribe any medications you might need.  Please refrain from all sexual activity for at least the next seven days.     Without s/s of PID.  Labs Reviewed  CERVICOVAGINAL ANCILLARY ONLY    Pending: Unresulted Labs (From admission, onward)    None        Will notify of any positive results. Instructed to refrain from sexual activity for at least seven days.  Reviewed expectations re: course of current medical issues. Questions answered. Outlined signs and symptoms indicating need for more acute intervention. Patient verbalized understanding. After Visit Summary given.   SUBJECTIVE:  Brianna Patel is a 20 y.o. female who presents with complaint of vaginal discharge. Onset gradual. First noticed  2 d ago . Describes discharge as thick and white; with odor. No specific aggravating or alleviating factors reported. Denies: urinary frequency, dysuria, and gross hematuria. Afebrile. No abdominal or pelvic pain. Normal PO intake wihout n/v. No genital rashes or lesions. Reports that she is sexually active with single female partner (new partner). OTC treatment: none. History of STI: none reported.  Patient's last menstrual period was 07/09/2021.   OBJECTIVE:  Vitals:   07/18/21 1208  BP: 103/67  Pulse: 70  Resp: 18  Temp: 98.1 F (36.7 C)  SpO2: 97%     General appearance: alert, cooperative, appears stated age and no distress Lungs: unlabored respirations; speaks full sentences without difficulty Back: no CVA tenderness; FROM at waist Abdomen: soft, non-tender GU: deferred Skin: warm and dry Psychological: alert and cooperative; normal mood and affect.    Labs Reviewed   CERVICOVAGINAL ANCILLARY ONLY    No Known Allergies  Past Medical History:  Diagnosis Date   Closed lumbar vertebral fracture (HCC)    History reviewed. No pertinent family history. Social History   Socioeconomic History   Marital status: Single    Spouse name: Not on file   Number of children: Not on file   Years of education: Not on file   Highest education level: Not on file  Occupational History   Not on file  Tobacco Use   Smoking status: Never   Smokeless tobacco: Never  Vaping Use   Vaping Use: Never used  Substance and Sexual Activity   Alcohol use: No    Comment: occasional   Drug use: Not Currently    Types: Marijuana   Sexual activity: Yes    Birth control/protection: Implant  Other Topics Concern   Not on file  Social History Narrative   Not on file   Social Determinants of Health   Financial Resource Strain: Not on file  Food Insecurity: Not on file  Transportation Needs: Not on file  Physical Activity: Not on file  Stress: Not on file  Social Connections: Not on file  Intimate Partner Violence: Not on file           Mardella Layman, MD 07/18/21 1227

## 2021-07-19 LAB — CERVICOVAGINAL ANCILLARY ONLY
Bacterial Vaginitis (gardnerella): NEGATIVE
Candida Glabrata: NEGATIVE
Candida Vaginitis: NEGATIVE
Chlamydia: NEGATIVE
Comment: NEGATIVE
Comment: NEGATIVE
Comment: NEGATIVE
Comment: NEGATIVE
Comment: NEGATIVE
Comment: NORMAL
Neisseria Gonorrhea: NEGATIVE
Trichomonas: NEGATIVE

## 2021-10-01 ENCOUNTER — Other Ambulatory Visit: Payer: Self-pay | Admitting: Obstetrics & Gynecology

## 2021-10-01 DIAGNOSIS — O3680X0 Pregnancy with inconclusive fetal viability, not applicable or unspecified: Secondary | ICD-10-CM

## 2021-10-02 ENCOUNTER — Other Ambulatory Visit: Payer: Medicaid Other

## 2021-10-09 ENCOUNTER — Other Ambulatory Visit: Payer: Medicaid Other

## 2021-10-31 ENCOUNTER — Encounter: Payer: Self-pay | Admitting: Obstetrics & Gynecology

## 2021-11-07 ENCOUNTER — Encounter: Payer: Self-pay | Admitting: Obstetrics & Gynecology

## 2021-11-07 ENCOUNTER — Other Ambulatory Visit: Payer: Self-pay

## 2021-11-07 ENCOUNTER — Ambulatory Visit (INDEPENDENT_AMBULATORY_CARE_PROVIDER_SITE_OTHER): Payer: Medicaid Other | Admitting: Obstetrics & Gynecology

## 2021-11-07 VITALS — BP 100/57 | HR 73 | Ht 63.0 in | Wt 179.8 lb

## 2021-11-07 DIAGNOSIS — Z3045 Encounter for surveillance of transdermal patch hormonal contraceptive device: Secondary | ICD-10-CM

## 2021-11-07 DIAGNOSIS — M5489 Other dorsalgia: Secondary | ICD-10-CM

## 2021-11-07 DIAGNOSIS — Z8742 Personal history of other diseases of the female genital tract: Secondary | ICD-10-CM | POA: Diagnosis not present

## 2021-11-07 LAB — POCT URINALYSIS DIPSTICK OB
Blood, UA: NEGATIVE
Glucose, UA: NEGATIVE
Ketones, UA: NEGATIVE
Leukocytes, UA: NEGATIVE
Nitrite, UA: NEGATIVE
POC,PROTEIN,UA: NEGATIVE

## 2021-11-07 NOTE — Progress Notes (Signed)
GYN VISIT Patient name: MELVINIA WILLERS MRN [id number]  Date of birth: 2001-06-08 Chief Complaint:   Back Pain  History of Present Illness:   Lenisha A Sniffen is a 20 y.o. G57P0010 female being seen today for the following:  -TAB follow up:  She is concerned that she is still testing positive for pregnancy s/p D&E for TAB 11/26 at [facility]. [facility]- per pt last hcG checked about 2 wks ago and was going down.  No bleeding, mild pelvic and back pain.  Mostly low back pain.  Pain intermittent, minimal relief with ibuprofen.  She notes vomiting the ibuprofen.  No dysuria or hematuria.  Some increased urinary frequency.  Contraception: using patch  Patient's last menstrual period was 08/20/2021 (approximate).  Depression screen [facility] 2/9 11/07/2021  Decreased Interest 2  Down, Depressed, Hopeless 0  PHQ - 2 Score 2  Altered sleeping 0  Tired, decreased energy 0  Change in appetite 0  Feeling bad or failure about yourself  0  Trouble concentrating 0  Moving slowly or fidgety/restless 0  Suicidal thoughts 0  PHQ-9 Score 2     Review of Systems:   Pertinent items are noted in HPI Denies fever/chills, dizziness, headaches, visual disturbances, fatigue, shortness of breath, chest pain, bowel movements, urination, or intercourse unless otherwise stated above.  Pertinent History Reviewed:  Reviewed past medical,surgical, social, obstetrical and family history.  Reviewed problem list, medications and allergies. Physical Assessment:   Vitals:   11/07/21 0843  BP: (!) 100/57  Pulse: 73  Weight: 179 lb 12.8 oz (81.6 kg)  Height: 5\' 3"  (1.6 m)  Body mass index is 31.85 kg/m.       Physical Examination:   General appearance: alert, well appearing, and in no distress  Psych: mood appropriate, normal affect  Skin: warm & dry   Cardiovascular: normal heart rate noted  Respiratory: normal respiratory effort, no distress  Abdomen: soft, non-tender, no  rebound, no guarding  Pelvic: VULVA: normal appearing vulva with no masses, tenderness or lesions, BIMANUAL EXAM: normal sized uterus, non-tender, no adnexal masses or tenderness appreciated  Extremities: no edema   Chaperone:  declined     Results for orders placed or performed in visit on 11/07/21 (from the past 24 hour(s))  POC Urinalysis Dipstick OB     Status: None   Collection Time: 11/07/21  9:32 AM  Result Value Ref Range   Color, UA     Clarity, UA     Glucose, UA Negative Negative   Bilirubin, UA     Ketones, UA neg    Spec Grav, UA     Blood, UA neg    pH, UA     POC,PROTEIN,UA Negative Negative, Trace, Small (1+), Moderate (2+), Large (3+), 4+   Urobilinogen, UA     Nitrite, UA neg    Leukocytes, UA Negative Negative   Appearance     Odor      Assessment & Plan:  1) Recent TAB -reassured pt that sometime HCG level may remain positive- most important is confirming that HCG level is appropriately dropping and returns to non-pregnancy state -hCG to be checked today, further evaluation pending results -may consider TVUS based on HCG -no evidence of retained products based on history/clinical picture  2) Contraception -continue with patch  3) Back pain -suspect symptoms may be pre-menstrual symptoms -will continue to monitor at this time -will r/o underlying UTI, UA negative  Orders Placed This Encounter  Procedures   Beta hCG quant (ref lab)   POC Urinalysis Dipstick OB    Return for TBD, []  please sign paper work for .   [employer], DO Attending Obstetrician & Gynecologist, [facility] for [facility]

## 2021-11-08 LAB — BETA HCG QUANT (REF LAB): hCG Quant: 22 m[IU]/mL

## 2021-11-21 ENCOUNTER — Other Ambulatory Visit: Payer: Medicaid Other

## 2021-11-21 DIAGNOSIS — Z8742 Personal history of other diseases of the female genital tract: Secondary | ICD-10-CM

## 2021-11-22 ENCOUNTER — Other Ambulatory Visit: Payer: Medicaid Other

## 2021-11-23 ENCOUNTER — Other Ambulatory Visit: Payer: Self-pay

## 2021-11-23 ENCOUNTER — Other Ambulatory Visit: Payer: Medicaid Other

## 2021-11-24 LAB — BETA HCG QUANT (REF LAB): hCG Quant: 3 m[IU]/mL

## 2022-04-02 ENCOUNTER — Ambulatory Visit: Payer: Medicaid Other | Admitting: Family Medicine

## 2022-04-12 ENCOUNTER — Ambulatory Visit
Admission: EM | Admit: 2022-04-12 | Discharge: 2022-04-12 | Disposition: A | Discharge status: Home / Self Care | Payer: Medicaid Other | Attending: Student | Admitting: Student

## 2022-04-12 DIAGNOSIS — B379 Candidiasis, unspecified: Secondary | ICD-10-CM | POA: Insufficient documentation

## 2022-04-12 MED ORDER — FLUCONAZOLE 150 MG PO TABS: 150.0000 mg | ORAL_TABLET | Freq: Every day | ORAL | 0 refills | Status: DC

## 2022-04-12 NOTE — ED Triage Notes (Signed)
Pt presents with c/o vaginal discharge and itching for past couple of days

## 2022-04-12 NOTE — Discharge Instructions (Signed)
Take medication as prescribed. With regard to your STI testing, your results should be available within the next 48 to 72 hours.  If the results are positive, you will be contacted to provide treatment.  If you have access to MyChart, you should be able to see your results there. Recommend eating foods such as yogurt to help with your symptoms. If you have recurrent yeast infections, suggest using boric acid suppositories.  Insert 1 suppository each night for 7 days, then insert 1 suppository weekly for prevention. Follow-up if symptoms do not improve.

## 2022-04-12 NOTE — ED Provider Notes (Signed)
[facility]    CSN: [id number] Arrival date & time: 04/12/22  1540      History   Chief Complaint Chief Complaint  Patient presents with   Vaginal Discharge    HPI Brianna Patel is a 21 y.o. female.   The patient is a 21 year old female who presents for vaginal discharge.  Symptoms have been present for the past 6 days.  She states the discharge is yellow and "thick and clumpy".  She also states that she has an odor, states that the odor is similar to "yeast" when asked.  She also complains of vaginal itching.  She denies any urinary symptoms or abdominal pain.  Last menstrual cycle was 03/22/2022.  She has had 1 female partner in the past 90 days.  She currently uses the Ortho Evra patch for birth control.  The history is provided by the patient.   Past Medical History:  Diagnosis Date   Closed lumbar vertebral fracture (HCC)     There are no problems to display for this patient.   Past Surgical History:  Procedure Laterality Date   INDUCED ABORTION      OB History     Gravida  1   Para      Term      Preterm      AB  1   Living         SAB      IAB      Ectopic      Multiple      Live Births               Home Medications    Prior to Admission medications   Medication Sig Start Date End Date Taking? Authorizing Provider  fluconazole (DIFLUCAN) 150 MG tablet Take 1 tablet (150 mg total) by mouth daily. Take one tablet today, if symptoms continue to persist may take every 72 hours up to 2 additional doses. 04/12/22  Yes Dwana Garin-Warren, Sadie Haber, NP  cetirizine (ZYRTEC ALLERGY) 10 MG tablet Take 1 tablet (10 mg total) by mouth daily. 02/26/20   Avegno, Zachery Dakins, FNP  cyclobenzaprine (FLEXERIL) 10 MG tablet Take 1 tablet (10 mg total) by mouth at bedtime. Patient not taking: Reported on 11/07/2021 06/05/20   Wurst, [place], PA-C  ibuprofen (ADVIL,MOTRIN) 600 MG tablet Take 1 tablet (600 mg total) every 6 (six) hours as needed by  mouth. 09/25/17   Loren Racer, MD  Burr Medico 150-35 MCG/24HR transdermal patch 1 patch once a week. 11/03/21   [provider]    Family History History reviewed. No pertinent family history.  Social History Social History   Tobacco Use   Smoking status: Never   Smokeless tobacco: Never  Vaping Use   Vaping Use: Never used  Substance Use Topics   Alcohol use: Yes    Comment: occasional   Drug use: Not Currently    Types: Marijuana     Allergies   Patient has no known allergies.   Review of Systems Review of Systems PER HPI  Physical Exam Triage Vital Signs ED Triage Vitals  Enc Vitals Group     BP 04/12/22 0924 127/73     Pulse Rate 04/12/22 0924 89     Resp 04/12/22 0924 20     Temp 04/12/22 0924 98.2 F (36.8 C)     Temp src --      SpO2 04/12/22 0924 98 %     Weight --  Height --      Head Circumference --      Peak Flow --      Pain Score 04/12/22 0923 0     Pain Loc --      Pain Edu? --      Excl. in GC? --    No data found.  Updated Vital Signs BP 127/73   Pulse 89   Temp 98.2 F (36.8 C)   Resp 20   LMP 03/22/2022   SpO2 98%   Visual Acuity Right Eye Distance:   Left Eye Distance:   Bilateral Distance:    Right Eye Near:   Left Eye Near:    Bilateral Near:     Physical Exam Vitals reviewed.  Constitutional:      General: She is not in acute distress.    Appearance: She is well-developed.  Eyes:     Pupils: Pupils are equal, round, and reactive to light.  Cardiovascular:     Rate and Rhythm: Normal rate and regular rhythm.     Heart sounds: Normal heart sounds.  Pulmonary:     Effort: Pulmonary effort is normal.     Breath sounds: Normal breath sounds.  Abdominal:     General: Bowel sounds are normal. There is no distension.     Palpations: Abdomen is soft.     Tenderness: There is no abdominal tenderness. There is no guarding or rebound.  Genitourinary:    Vagina: Normal. No vaginal discharge.   Musculoskeletal:     Cervical back: Normal range of motion.  Skin:    General: Skin is warm and dry.     Findings: No erythema or rash.  Neurological:     General: No focal deficit present.     Mental Status: She is alert and oriented to person, place, and time.     Cranial Nerves: No cranial nerve deficit.  Psychiatric:        Mood and Affect: Mood normal.        Behavior: Behavior normal.     UC Treatments / Results  Labs (all labs ordered are listed, but only abnormal results are displayed) Labs Reviewed  CERVICOVAGINAL ANCILLARY ONLY    EKG   Radiology No results found.  Procedures Procedures (including critical care time)  Medications Ordered in UC Medications - No data to display  Initial Impression / Assessment and Plan / UC Course  I have reviewed the triage vital signs and the nursing notes.  Pertinent labs & imaging results that were available during my care of the patient were reviewed by me and considered in my medical decision making (see chart for details).  Patient is a 21 year old female who presents for vaginal discharge.  Based on her history, the patient's symptoms are consistent with a yeast infection.  Her discharge is thick and clumpy, and she also has vaginal itching.  There is no concern for any other symptoms such as acute abdomen at this time.  Cytology results are pending.  Supportive care was recommended.  Follow-up if symptoms do not improve. Final Clinical Impressions(s) / UC Diagnoses   Final diagnoses:  Yeast infection     Discharge Instructions      Take medication as prescribed. With regard to your STI testing, your results should be available within the next 48 to 72 hours.  If the results are positive, you will be contacted to provide treatment.  If you have access to MyChart, you should be able to see your  results there. Recommend eating foods such as yogurt to help with your symptoms. If you have recurrent yeast infections,  suggest using boric acid suppositories.  Insert 1 suppository each night for 7 days, then insert 1 suppository weekly for prevention. Follow-up if symptoms do not improve.     ED Prescriptions     Medication Sig Dispense Auth. Provider   fluconazole (DIFLUCAN) 150 MG tablet Take 1 tablet (150 mg total) by mouth daily. Take one tablet today, if symptoms continue to persist may take every 72 hours up to 2 additional doses. 3 tablet Sadee Osland-Warren, Sadie Haberhristie J, NP      PDMP not reviewed this encounter.   Abran CantorLeath-Warren, Suraya Vidrine J, NP 04/12/22 [phone]

## 2022-04-16 LAB — CERVICOVAGINAL ANCILLARY ONLY
Bacterial Vaginitis (gardnerella): NEGATIVE
Candida Glabrata: NEGATIVE
Candida Vaginitis: POSITIVE — AB
Chlamydia: NEGATIVE
Comment: NEGATIVE
Comment: NEGATIVE
Comment: NEGATIVE
Comment: NEGATIVE
Comment: NEGATIVE
Comment: NORMAL
Neisseria Gonorrhea: NEGATIVE
Trichomonas: NEGATIVE

## 2022-05-01 ENCOUNTER — Ambulatory Visit: Payer: Medicaid Other

## 2022-05-09 ENCOUNTER — Ambulatory Visit
Admission: RE | Admit: 2022-05-09 | Discharge: 2022-05-09 | Disposition: A | Discharge status: Home / Self Care | Payer: Medicaid Other | Source: Ambulatory Visit | Attending: Family Medicine | Admitting: Family Medicine

## 2022-05-09 VITALS — BP 126/81 | HR 76 | Temp 99.4°F | Resp 16

## 2022-05-09 DIAGNOSIS — N898 Other specified noninflammatory disorders of vagina: Secondary | ICD-10-CM | POA: Diagnosis present

## 2022-05-09 LAB — POCT URINE PREGNANCY: Preg Test, Ur: NEGATIVE

## 2022-05-09 NOTE — ED Triage Notes (Signed)
Pt reports brown/white vaginal discharge x 2-3 days. Pt requested STD's test.

## 2022-05-09 NOTE — ED Provider Notes (Signed)
[facility]    CSN: [id number] Arrival date & time: 05/09/22  1736      History   Chief Complaint Chief Complaint  Patient presents with   Vaginal Discharge    Entered by patient   Appointment    1800    HPI Brianna Patel is a 20 y.o. female.   Presenting today with 2 to 3-day history of brown/white vaginal discharge, irritation.  Denies fever, chills, abdominal pain, pelvic pain, dysuria, hematuria.  Taking boric acid suppositories and probiotics with no relief.  Requesting STD test for screening, no known exposures.  LMP was about 3 weeks ago but wanting to be checked for pregnancy as she did miss 1 day on her patch change.     Past Medical History:  Diagnosis Date   Closed lumbar vertebral fracture (HCC)     There are no problems to display for this patient.   Past Surgical History:  Procedure Laterality Date   INDUCED ABORTION      OB History     Gravida  1   Para      Term      Preterm      AB  1   Living         SAB      IAB      Ectopic      Multiple      Live Births               Home Medications    Prior to Admission medications   Medication Sig Start Date End Date Taking? Authorizing Provider  cetirizine (ZYRTEC ALLERGY) 10 MG tablet Take 1 tablet (10 mg total) by mouth daily. 02/26/20   Avegno, Zachery Dakins, FNP  cyclobenzaprine (FLEXERIL) 10 MG tablet Take 1 tablet (10 mg total) by mouth at bedtime. Patient not taking: Reported on 11/07/2021 06/05/20   Wurst, [place], PA-C  fluconazole (DIFLUCAN) 150 MG tablet Take 1 tablet (150 mg total) by mouth daily. Take one tablet today, if symptoms continue to persist may take every 72 hours up to 2 additional doses. 04/12/22   Leath-Warren, Sadie Haber, NP  ibuprofen (ADVIL,MOTRIN) 600 MG tablet Take 1 tablet (600 mg total) every 6 (six) hours as needed by mouth. 09/25/17   Loren Racer, MD  Burr Medico 150-35 MCG/24HR transdermal patch 1 patch once a week. 11/03/21   [provider]    Family History No family history on file.  Social History Social History   Tobacco Use   Smoking status: Never   Smokeless tobacco: Never  Vaping Use   Vaping Use: Never used  Substance Use Topics   Alcohol use: Yes    Comment: occasional   Drug use: Not Currently    Types: Marijuana     Allergies   Patient has no known allergies.   Review of Systems Review of Systems Per HPI  Physical Exam Triage Vital Signs ED Triage Vitals  Enc Vitals Group     BP 05/09/22 1749 126/81     Pulse Rate 05/09/22 1749 76     Resp 05/09/22 1749 16     Temp 05/09/22 1749 99.4 F (37.4 C)     Temp Source 05/09/22 1749 Oral     SpO2 05/09/22 1749 97 %     Weight --      Height --      Head Circumference --      Peak Flow --  Pain Score 05/09/22 1747 0     Pain Loc --      Pain Edu? --      Excl. in GC? --    No data found.  Updated Vital Signs BP 126/81 (BP Location: Right Arm)   Pulse 76   Temp 99.4 F (37.4 C) (Oral)   Resp 16   LMP  (Within Weeks) Comment: 3 weeks  SpO2 97%   Visual Acuity Right Eye Distance:   Left Eye Distance:   Bilateral Distance:    Right Eye Near:   Left Eye Near:    Bilateral Near:     Physical Exam Vitals and nursing note reviewed.  Constitutional:      Appearance: Normal appearance. She is not ill-appearing.  HENT:     Head: Atraumatic.  Eyes:     Extraocular Movements: Extraocular movements intact.     Conjunctiva/sclera: Conjunctivae normal.  Cardiovascular:     Rate and Rhythm: Normal rate and regular rhythm.     Heart sounds: Normal heart sounds.  Pulmonary:     Effort: Pulmonary effort is normal.     Breath sounds: Normal breath sounds.  Abdominal:     General: Bowel sounds are normal. There is no distension.     Palpations: Abdomen is soft.     Tenderness: There is no abdominal tenderness. There is no right CVA tenderness, left CVA tenderness or guarding.  Genitourinary:    Comments: GU exam  deferred, self swab performed Musculoskeletal:        General: Normal range of motion.     Cervical back: Normal range of motion and neck supple.  Skin:    General: Skin is warm and dry.  Neurological:     Mental Status: She is alert and oriented to person, place, and time.  Psychiatric:        Mood and Affect: Mood normal.        Thought Content: Thought content normal.        Judgment: Judgment normal.    UC Treatments / Results  Labs (all labs ordered are listed, but only abnormal results are displayed) Labs Reviewed  POCT URINE PREGNANCY  CERVICOVAGINAL ANCILLARY ONLY    EKG   Radiology No results found.  Procedures Procedures (including critical care time)  Medications Ordered in UC Medications - No data to display  Initial Impression / Assessment and Plan / UC Course  I have reviewed the triage vital signs and the nursing notes.  Pertinent labs & imaging results that were available during my care of the patient were reviewed by me and considered in my medical decision making (see chart for details).     Vaginal swab pending, urine pregnancy negative, vitals and exam reassuring.  We will treat based on swab results.  Discussed abstinence and good vaginal hygiene.  Final Clinical Impressions(s) / UC Diagnoses   Final diagnoses:  Vaginal discharge   Discharge Instructions   None    ED Prescriptions   None    PDMP not reviewed this encounter.   Particia Nearing, [place] 05/09/22 1836

## 2022-05-10 LAB — CERVICOVAGINAL ANCILLARY ONLY
Bacterial Vaginitis (gardnerella): NEGATIVE
Chlamydia: NEGATIVE
Comment: NEGATIVE
Comment: NEGATIVE
Comment: NEGATIVE
Comment: NORMAL
Neisseria Gonorrhea: NEGATIVE
Trichomonas: NEGATIVE

## 2022-05-15 ENCOUNTER — Ambulatory Visit (HOSPITAL_COMMUNITY)
Admission: RE | Admit: 2022-05-15 | Discharge: 2022-05-15 | Disposition: A | Discharge status: Home / Self Care | Payer: Medicaid Other | Source: Ambulatory Visit | Attending: Family Medicine | Admitting: Family Medicine

## 2022-05-15 ENCOUNTER — Encounter (HOSPITAL_COMMUNITY): Payer: Self-pay

## 2022-05-15 VITALS — BP 115/77 | HR 81 | Temp 98.7°F | Resp 18

## 2022-05-15 DIAGNOSIS — N898 Other specified noninflammatory disorders of vagina: Secondary | ICD-10-CM | POA: Diagnosis present

## 2022-05-15 LAB — POC URINE PREG, ED: Preg Test, Ur: NEGATIVE

## 2022-05-15 MED ORDER — METRONIDAZOLE 500 MG PO TABS: 500.0000 mg | ORAL_TABLET | Freq: Two times a day (BID) | ORAL | 0 refills | Status: DC

## 2022-05-15 NOTE — Discharge Instructions (Signed)
We have sent testing for sexually transmitted infections. We will notify you of any positive results once they are received. If required, we will prescribe any medications you might need.  Please refrain from all sexual activity for at least the next seven days.  

## 2022-05-15 NOTE — ED Provider Notes (Signed)
[facility]   [id number] 05/15/22 Arrival Time: 1720  ASSESSMENT & PLAN:  1. Vaginal discharge    She is very concerned she has BV. Agree to empiric tx with repeat vaginal cytology pending.    Discharge Instructions      We have sent testing for sexually transmitted infections. We will notify you of any positive results once they are received. If required, we will prescribe any medications you might need.  Please refrain from all sexual activity for at least the next seven days.     Without s/s of PID.  Labs Reviewed  POC URINE PREG, ED  CERVICOVAGINAL ANCILLARY ONLY    Reviewed expectations re: course of current medical issues. Questions answered. Outlined signs and symptoms indicating need for more acute intervention. Patient verbalized understanding. After Visit Summary given.   SUBJECTIVE:  Brianna Patel is a 21 y.o. female who presents with complaint of vaginal discharge. Onset gradual; x 1 weeks; has had sex since recent vag cytology a week ago. Concerned about BV. Discharge with odor. Afebrile. No abdominal or pelvic pain. Normal PO intake wihout n/v. No genital rashes or lesions.  Patient's last menstrual period was 04/14/2022 (approximate).   OBJECTIVE:  Vitals:   05/15/22 1823 05/15/22 1824  BP:  115/77  Pulse:  81  Resp:  18  Temp:  98.7 F (37.1 C)  TempSrc:  Oral  SpO2: 98% 100%     General appearance: alert, cooperative, appears stated age and no distress Lungs: unlabored respirations; speaks full sentences without difficulty Back: no CVA tenderness; FROM at waist Abdomen: soft, non-tender GU: deferred Skin: warm and dry Psychological: alert and cooperative; normal mood and affect.  Results for orders placed or performed during the hospital encounter of 05/15/22  POC urine pregnancy  Result Value Ref Range   Preg Test, Ur NEGATIVE NEGATIVE    Labs Reviewed  POC URINE PREG, ED  CERVICOVAGINAL ANCILLARY ONLY    No  Known Allergies  Past Medical History:  Diagnosis Date   Closed lumbar vertebral fracture (HCC)    History reviewed. No pertinent family history. Social History   Socioeconomic History   Marital status: Single    Spouse name: Not on file   Number of children: Not on file   Years of education: Not on file   Highest education level: Not on file  Occupational History   Not on file  Tobacco Use   Smoking status: Never   Smokeless tobacco: Never  Vaping Use   Vaping Use: Never used  Substance and Sexual Activity   Alcohol use: Yes    Comment: occasional   Drug use: Not Currently    Types: Marijuana   Sexual activity: Yes    Birth control/protection: Patch  Other Topics Concern   Not on file  Social History Narrative   Not on file   Social Determinants of Health   Financial Resource Strain: Low Risk  (11/07/2021)   Overall Financial Resource Strain (CARDIA)    Difficulty of Paying Living Expenses: Not very hard  Food Insecurity: No Food Insecurity (11/07/2021)   Hunger Vital Sign    Worried About Running Out of Food in the Last Year: Never true    Ran Out of Food in the Last Year: Never true  Transportation Needs: No Transportation Needs (11/07/2021)   PRAPARE - Administrator, Civil Service (Medical): No    Lack of Transportation (Non-Medical): No  Physical Activity: Insufficiently Active (11/07/2021)   Exercise  Vital Sign    Days of Exercise per Week: 2 days    Minutes of Exercise per Session: 40 min  Stress: Stress Concern Present (11/07/2021)   [employer] of Occupational Health - Occupational Stress Questionnaire    Feeling of Stress : To some extent  Social Connections: Moderately Isolated (11/07/2021)   Social Connection and Isolation Panel [NHANES]    Frequency of Communication with Friends and Family: More than three times a week    Frequency of Social Gatherings with Friends and Family: Twice a week    Attends Religious Services: 1 to 4  times per year    Active Member of [employer] or Organizations: No    Attends Banker Meetings: Never    Marital Status: Never married  Intimate Partner Violence: Not At Risk (11/07/2021)   Humiliation, Afraid, Rape, and Kick questionnaire    Fear of Current or Ex-Partner: No    Emotionally Abused: No    Physically Abused: No    Sexually Abused: No           Mardella Layman, MD 05/15/22 1934

## 2022-05-15 NOTE — ED Triage Notes (Signed)
Pt reports white vaginal discharge with odor x 1 week. States she was seen at [facility] last week and STD testing was neg.

## 2022-05-16 LAB — CERVICOVAGINAL ANCILLARY ONLY
Bacterial Vaginitis (gardnerella): POSITIVE — AB
Candida Glabrata: NEGATIVE
Candida Vaginitis: NEGATIVE
Chlamydia: NEGATIVE
Comment: NEGATIVE
Comment: NEGATIVE
Comment: NEGATIVE
Comment: NEGATIVE
Comment: NEGATIVE
Comment: NORMAL
Neisseria Gonorrhea: NEGATIVE
Trichomonas: NEGATIVE

## 2022-05-24 ENCOUNTER — Emergency Department (HOSPITAL_COMMUNITY): Payer: Medicaid Other

## 2022-05-24 ENCOUNTER — Encounter (HOSPITAL_COMMUNITY): Payer: Self-pay | Admitting: Emergency Medicine

## 2022-05-24 ENCOUNTER — Other Ambulatory Visit: Payer: Self-pay

## 2022-05-24 ENCOUNTER — Emergency Department (HOSPITAL_COMMUNITY)
Admission: EM | Admit: 2022-05-24 | Discharge: 2022-05-24 | Disposition: A | Discharge status: Home / Self Care | Payer: Medicaid Other | Attending: Student | Admitting: Student

## 2022-05-24 DIAGNOSIS — R112 Nausea with vomiting, unspecified: Secondary | ICD-10-CM | POA: Diagnosis not present

## 2022-05-24 DIAGNOSIS — Z20822 Contact with and (suspected) exposure to covid-19: Secondary | ICD-10-CM | POA: Diagnosis not present

## 2022-05-24 DIAGNOSIS — R519 Headache, unspecified: Secondary | ICD-10-CM | POA: Diagnosis not present

## 2022-05-24 DIAGNOSIS — R1010 Upper abdominal pain, unspecified: Secondary | ICD-10-CM | POA: Insufficient documentation

## 2022-05-24 DIAGNOSIS — K529 Noninfective gastroenteritis and colitis, unspecified: Secondary | ICD-10-CM

## 2022-05-24 LAB — COMPREHENSIVE METABOLIC PANEL
ALT: 17 U/L (ref 0–44)
AST: 17 U/L (ref 15–41)
Albumin: 4.4 g/dL (ref 3.5–5.0)
Alkaline Phosphatase: 37 U/L — ABNORMAL LOW (ref 38–126)
Anion gap: 11 (ref 5–15)
BUN: 7 mg/dL (ref 6–20)
CO2: 21 mmol/L — ABNORMAL LOW (ref 22–32)
Calcium: 9.2 mg/dL (ref 8.9–10.3)
Chloride: 102 mmol/L (ref 98–111)
Creatinine, Ser: 0.67 mg/dL (ref 0.44–1.00)
GFR, Estimated: >60 mL/min (ref >60)
Glucose, Bld: 73 mg/dL (ref 70–99)
Potassium: 4 mmol/L (ref 3.5–5.1)
Sodium: 134 mmol/L — ABNORMAL LOW (ref 135–145)
Total Bilirubin: 0.5 mg/dL (ref 0.3–1.2)
Total Protein: 8 g/dL (ref 6.5–8.1)

## 2022-05-24 LAB — URINALYSIS, ROUTINE W REFLEX MICROSCOPIC
Bilirubin Urine: NEGATIVE
Glucose, UA: NEGATIVE mg/dL
Hgb urine dipstick: NEGATIVE
Ketones, ur: 80 mg/dL — AB
Leukocytes,Ua: NEGATIVE
Nitrite: NEGATIVE
Protein, ur: NEGATIVE mg/dL
Specific Gravity, Urine: <1.031 — ABNORMAL HIGH (ref 1.005–1.030)
pH: 6 (ref 5.0–8.0)

## 2022-05-24 LAB — CBC WITH DIFFERENTIAL/PLATELET
Abs Immature Granulocytes: 0.03 10*3/uL (ref 0.00–0.07)
Basophils Absolute: 0 10*3/uL (ref 0.0–0.1)
Basophils Relative: 0 %
Eosinophils Absolute: 0 10*3/uL (ref 0.0–0.5)
Eosinophils Relative: 0 %
HCT: 51.1 % — ABNORMAL HIGH (ref 36.0–46.0)
Hemoglobin: 15.5 g/dL — ABNORMAL HIGH (ref 12.0–15.0)
Immature Granulocytes: 0 %
Lymphocytes Relative: 24 %
Lymphs Abs: 2.4 10*3/uL (ref 0.7–4.0)
MCH: 26.5 pg (ref 26.0–34.0)
MCHC: 30.3 g/dL (ref 30.0–36.0)
MCV: 87.5 fL (ref 80.0–100.0)
Monocytes Absolute: 0.7 10*3/uL (ref 0.1–1.0)
Monocytes Relative: 7 %
Neutro Abs: 6.9 10*3/uL (ref 1.7–7.7)
Neutrophils Relative %: 69 %
Platelets: 299 10*3/uL (ref 150–400)
RBC: 5.84 MIL/uL — ABNORMAL HIGH (ref 3.87–5.11)
RDW: 14.9 % (ref 11.5–15.5)
WBC: 10 10*3/uL (ref 4.0–10.5)
nRBC: 0 % (ref 0.0–0.2)

## 2022-05-24 LAB — HCG, QUANTITATIVE, PREGNANCY: hCG, Beta Chain, Quant, S: <1 m[IU]/mL (ref <5)

## 2022-05-24 LAB — MONONUCLEOSIS SCREEN: Mono Screen: NEGATIVE

## 2022-05-24 LAB — SARS CORONAVIRUS 2 BY RT PCR: SARS Coronavirus 2 by RT PCR: NEGATIVE

## 2022-05-24 LAB — GROUP A STREP BY PCR: Group A Strep by PCR: NOT DETECTED

## 2022-05-24 LAB — LIPASE, BLOOD: Lipase: 27 U/L (ref 11–51)

## 2022-05-24 MED ORDER — MORPHINE SULFATE (PF) 4 MG/ML IV SOLN
4.0000 mg | Freq: Once | INTRAVENOUS | Status: AC
  Administered 2022-05-24: 4 mg via INTRAVENOUS
  Filled 2022-05-24: qty 1

## 2022-05-24 MED ORDER — DICYCLOMINE HCL 20 MG PO TABS: 20.0000 mg | ORAL_TABLET | Freq: Two times a day (BID) | ORAL | 0 refills | Status: DC

## 2022-05-24 MED ORDER — ONDANSETRON HCL 4 MG/2ML IJ SOLN
4.0000 mg | Freq: Once | INTRAMUSCULAR | Status: AC
  Administered 2022-05-24: 4 mg via INTRAVENOUS
  Filled 2022-05-24: qty 2

## 2022-05-24 MED ORDER — ONDANSETRON 4 MG PO TBDP: 4.0000 mg | ORAL_TABLET | Freq: Three times a day (TID) | ORAL | 0 refills | Status: DC | PRN

## 2022-05-24 MED ORDER — ACETAMINOPHEN 500 MG PO TABS
1000.0000 mg | ORAL_TABLET | Freq: Once | ORAL | Status: AC
  Administered 2022-05-24: 1000 mg via ORAL
  Filled 2022-05-24: qty 2

## 2022-05-24 MED ORDER — IOHEXOL 300 MG/ML  SOLN
100.0000 mL | Freq: Once | INTRAMUSCULAR | Status: AC | PRN
  Administered 2022-05-24: 100 mL via INTRAVENOUS

## 2022-05-24 NOTE — ED Notes (Signed)
Patient transported to CT 

## 2022-05-24 NOTE — ED Notes (Signed)
Informed pt need for urine sample 

## 2022-05-24 NOTE — ED Provider Notes (Signed)
  Physical Exam  BP 127/72   Pulse 79   Temp 98.3 F (36.8 C) (Oral)   Resp 18   LMP 05/18/2022   SpO2 99%   Physical Exam Vitals and nursing note reviewed.  Constitutional:      General: She is not in acute distress.    Appearance: Normal appearance.  HENT:     Head: Normocephalic and atraumatic.  Eyes:     General:        Right eye: No discharge.        Left eye: No discharge.  Cardiovascular:     Comments: Regular rate and rhythm.  S1/S2 are distinct without any evidence of murmur, rubs, or gallops.  Radial pulses are 2+ bilaterally.  Dorsalis pedis pulses are 2+ bilaterally.  No evidence of pedal edema. Pulmonary:     Comments: Clear to auscultation bilaterally.  Normal effort.  No respiratory distress.  No evidence of wheezes, rales, or rhonchi heard throughout. Abdominal:     General: Abdomen is flat. Bowel sounds are normal. There is no distension.     Tenderness: There is no abdominal tenderness. There is no guarding or rebound.  Musculoskeletal:        General: Normal range of motion.     Cervical back: Neck supple.  Skin:    General: Skin is warm and dry.     Findings: No rash.  Neurological:     General: No focal deficit present.     Mental Status: She is alert.  Psychiatric:        Mood and Affect: Mood normal.        Behavior: Behavior normal.     Procedures  Procedures  ED Course / MDM   Clinical Course as of 05/24/22 1905  Fri May 24, 2022  1846 At reexam, patient having complaints of worsening nausea, continued left upper quadrant pain.  Her mono and her strep test are negative.  IV was started, Zofran and morphine were ordered, CT imaging was also ordered to better define source of her abdominal pain symptoms. [JI]    Clinical Course User Index [JI] Burgess Amor, PA-C   Medical Decision Making Amount and/or Complexity of Data Reviewed Labs: ordered. Radiology: ordered.  Risk OTC drugs. Prescription drug management.   Accepted handoff at  shift change from Idol PA-C. Please see prior provider note for more detail.   Briefly: Patient is 21 y.o. female patient who presents to the emergency department today for further evaluation of multiple complaints including left upper abdominal pain General myalgias, and sore throat in addition to nausea and vomiting.   Plan: Patient's work-up ultimately unrevealing thus far.  Patient still complaining of left upper quadrant abdominal pain.  Pending CT abdomen pelvis with contrast patient will likely be able to go home if results are normal with treatment for viral gastroenteritis.  On reevaluation, patient's abdominal pain has improved.  Patient wishing to go home.  I notified her of all labs and imaging at the bedside.  This is likely gastroenteritis.  I will prescribe her Zofran and Bentyl to go home with.  She was encouraged to return to the [facility] apartment for any worsening symptoms you might have.  She is safe for discharge at this time.              Teressa Lower, PA-C 05/24/22 2020    Glendora Score, MD 05/26/22 [phone]

## 2022-05-24 NOTE — ED Triage Notes (Signed)
Left abd pain, upper back pain, sorethroat, n/v/d x 3 days. C/o headache. Emesis x 3/diarrhea x 4 in last 24 hours. Has been on abx recently 2 days ago for bacterial vaginosis.

## 2022-05-24 NOTE — ED Provider Notes (Signed)
[facility] EMERGENCY DEPARTMENT Provider Note   CSN: [id number] Arrival date & time: 05/24/22  1431     History  Chief Complaint  Patient presents with  . Abdominal Pain    Brianna Patel is a 21 y.o. female with no significant past medical history, however was treated for bacterial vaginosis when seen at our urgent care center on June 28 completed a course of Flagyl and Diflucan empirically although her testing was ultimately only positive for BV.  Of note, she was negative for GC and chlamydia.  She reports the symptoms have resolved.  She presents today with multiple complaints, left upper abdominal pain which radiates into her back, generalized myalgias, generalized headache, sore throat along with nausea and vomiting which started 3 days ago.  She describes having 3 specific episodes of vomiting, nonbloody along with diarrhea, 4 times in the last 24 hours.  She states any thing she tries to eat or drink results in an episode of diarrhea.  This has been nonbloody, describes watery brown stools.  She has been afebrile.  Denies any exposures to others with similar symptoms.  She currently works at a call center.  She has not been sexually active since she was screened for STDs at our urgent care center.  The history is provided by the patient.       Home Medications Prior to Admission medications   Medication Sig Start Date End Date Taking? Authorizing Provider  Burr Medico 150-35 MCG/24HR transdermal patch 1 patch once a week. 11/03/21  Yes [provider]  fluconazole (DIFLUCAN) 150 MG tablet Take 1 tablet (150 mg total) by mouth daily. Take one tablet today, if symptoms continue to persist may take every 72 hours up to 2 additional doses. Patient not taking: Reported on 05/24/2022 04/12/22   Leath-Warren, Sadie Haber, NP  metroNIDAZOLE (FLAGYL) 500 MG tablet Take 1 tablet (500 mg total) by mouth 2 (two) times daily. Patient not taking: Reported on 05/24/2022 05/15/22   Mardella Layman,  MD      Allergies    Patient has no known allergies.    Review of Systems   Review of Systems  Constitutional:  Negative for chills and fever.  HENT:  Positive for sore throat. Negative for congestion.   Eyes: Negative.   Respiratory:  Negative for chest tightness and shortness of breath.   Cardiovascular:  Negative for chest pain.  Gastrointestinal:  Positive for diarrhea, nausea and vomiting. Negative for abdominal pain.  Genitourinary: Negative.  Negative for dysuria and vaginal discharge.  Musculoskeletal:  Negative for arthralgias, joint swelling and neck pain.  Skin: Negative.  Negative for rash and wound.  Neurological:  Negative for dizziness, weakness, light-headedness, numbness and headaches.  Psychiatric/Behavioral: Negative.      Physical Exam Updated Vital Signs BP 127/72   Pulse 79   Temp 98.3 F (36.8 C) (Oral)   Resp 18   LMP 05/18/2022 (Approximate)   SpO2 99%  Physical Exam Vitals and nursing note reviewed.  Constitutional:      Appearance: She is well-developed.  HENT:     Head: Normocephalic and atraumatic.  Eyes:     Conjunctiva/sclera: Conjunctivae normal.  Cardiovascular:     Rate and Rhythm: Normal rate and regular rhythm.     Heart sounds: Normal heart sounds.  Pulmonary:     Effort: Pulmonary effort is normal.     Breath sounds: Normal breath sounds. No wheezing.  Abdominal:     General: Bowel sounds are normal.  Palpations: Abdomen is soft.     Tenderness: There is no abdominal tenderness.  Musculoskeletal:        General: Normal range of motion.     Cervical back: Normal range of motion.  Skin:    General: Skin is warm and dry.  Neurological:     Mental Status: She is alert.     ED Results / Procedures / Treatments   Labs (all labs ordered are listed, but only abnormal results are displayed) Labs Reviewed  CBC WITH DIFFERENTIAL/PLATELET - Abnormal; Notable for the following components:      Result Value   RBC 5.84 (*)     Hemoglobin 15.5 (*)    HCT 51.1 (*)    All other components within normal limits  COMPREHENSIVE METABOLIC PANEL - Abnormal; Notable for the following components:   Sodium 134 (*)    CO2 21 (*)    Alkaline Phosphatase 37 (*)    All other components within normal limits  GROUP A STREP BY PCR  SARS CORONAVIRUS 2 BY RT PCR  MONONUCLEOSIS SCREEN  LIPASE, BLOOD  HCG, QUANTITATIVE, PREGNANCY  URINALYSIS, ROUTINE W REFLEX MICROSCOPIC    EKG None  Radiology No results found.  Procedures Procedures    Medications Ordered in ED Medications  acetaminophen (TYLENOL) tablet 1,000 mg (1,000 mg Oral Given 05/24/22 1635)  ondansetron (ZOFRAN) injection 4 mg (4 mg Intravenous Given 05/24/22 1841)  morphine (PF) 4 MG/ML injection 4 mg (4 mg Intravenous Given 05/24/22 1840)    ED Course/ Medical Decision Making/ A&P Clinical Course as of 05/24/22 1849  Fri May 24, 2022  1846 At reexam, patient having complaints of worsening nausea, continued left upper quadrant pain.  Her mono and her strep test are negative.  IV was started, Zofran and morphine were ordered, CT imaging was also ordered to better define source of her abdominal pain symptoms. [JI]    Clinical Course User Index [JI] Victoriano Lain                           Medical Decision Making Patient with multiple symptoms including sore throat, nausea vomiting diarrhea times the past 3 days.  Generalized headache, left upper quadrant pain.  Symptoms could represent a viral or bacterial infection including strep or mononucleosis, viral gastroenteritis or bacterial abdominal source.  Strep and monotest are negative.  She continues to have left upper quadrant pain, nausea and dry heaves in the department, no actual vomiting.  She was given IV Zofran, will plan CT abdominal and pelvis imaging.  She was recently treated for bacterial vaginosis which she has completed Flagyl, was empirically also given Diflucan which she also completed.  She has no  vaginal complaints, denies pelvic pain.  She had a negative GC and chlamydia test on June 28.  Pending CT imaging at this time, discussed with Enos Fling, PA-C who assumes patient care.  If CT is negative would treat for presumptive gastroenteritis and symptom relief including nausea and diarrhea medications.  Amount and/or Complexity of Data Reviewed Labs: ordered. Radiology: ordered.  Risk OTC drugs. Prescription drug management.            Final Clinical Impression(s) / ED Diagnoses Final diagnoses:  None    Rx / DC Orders ED Discharge Orders     None         Victoriano Lain 05/24/22 1850    Kommor, [place], MD 05/25/22 [phone]

## 2022-05-24 NOTE — ED Notes (Signed)
Pt ambulated to the bathroom.  

## 2022-05-24 NOTE — Discharge Instructions (Signed)
Please return to the [facility] department for any worsening symptoms.  I have written 2 prescriptions today.  Zofran for anti-nausea and Bentyl for abdominal cramping.

## 2022-05-30 ENCOUNTER — Emergency Department (HOSPITAL_COMMUNITY)
Admission: EM | Admit: 2022-05-30 | Discharge: 2022-05-30 | Disposition: A | Discharge status: Home / Self Care | Payer: Medicaid Other | Attending: Emergency Medicine | Admitting: Emergency Medicine

## 2022-05-30 ENCOUNTER — Other Ambulatory Visit: Payer: Self-pay

## 2022-05-30 ENCOUNTER — Encounter (HOSPITAL_COMMUNITY): Payer: Self-pay

## 2022-05-30 DIAGNOSIS — T7840XA Allergy, unspecified, initial encounter: Secondary | ICD-10-CM | POA: Diagnosis present

## 2022-05-30 DIAGNOSIS — L500 Allergic urticaria: Secondary | ICD-10-CM | POA: Insufficient documentation

## 2022-05-30 MED ORDER — FAMOTIDINE 20 MG PO TABS
20.0000 mg | ORAL_TABLET | Freq: Once | ORAL | Status: AC
  Administered 2022-05-30: 20 mg via ORAL
  Filled 2022-05-30: qty 1

## 2022-05-30 MED ORDER — DIPHENHYDRAMINE HCL 25 MG PO CAPS
25.0000 mg | ORAL_CAPSULE | Freq: Once | ORAL | Status: AC
  Administered 2022-05-30: 25 mg via ORAL
  Filled 2022-05-30: qty 1

## 2022-05-30 MED ORDER — PREDNISONE 50 MG PO TABS
50.0000 mg | ORAL_TABLET | Freq: Every day | ORAL | 0 refills | Status: DC
Start: 2022-05-31 — End: 2022-06-03

## 2022-05-30 MED ORDER — METHYLPREDNISOLONE SODIUM SUCC 125 MG IJ SOLR
125.0000 mg | Freq: Once | INTRAMUSCULAR | Status: AC
  Administered 2022-05-30: 125 mg via INTRAMUSCULAR
  Filled 2022-05-30: qty 2

## 2022-05-30 NOTE — ED Triage Notes (Signed)
Pt presents to ED with complaints of hives all over body when she woke up this am. Pt states she went to work and it became worse and her throat became scratchy

## 2022-05-30 NOTE — ED Provider Notes (Signed)
[facility] EMERGENCY DEPARTMENT Provider Note   CSN: [id number] Arrival date & time: 05/30/22  1259     History  Chief Complaint  Patient presents with   Allergic Reaction    Brianna Patel is a 21 y.o. female who presents emergency department for evaluation allergic reaction that was first noticed upon waking earlier this morning.  Patient states that she woke up with pruritic hives on her upper extremities, chest neck and on parts of her face.  She applied hydrocortisone cream to the areas on her neck without any improvement in symptoms.  She denies shortness of breath or difficulty breathing, although she states that her voice felt "scratchy".  She has no previous history of similar symptoms.  She also had 1 episode of diarrhea last night.  She denies chest pain, abdominal pain, nausea, vomiting, tongue swelling or facial swelling.  No other treatment prior to arrival.  She denies new lotions, soaps, detergents.   Allergic Reaction Presenting symptoms: rash        Home Medications Prior to Admission medications   Medication Sig Start Date End Date Taking? Authorizing Provider  predniSONE (DELTASONE) 50 MG tablet Take 1 tablet (50 mg total) by mouth daily. 05/31/22  Yes Raynald Blend R, PA-C  dicyclomine (BENTYL) 20 MG tablet Take 1 tablet (20 mg total) by mouth 2 (two) times daily. 05/24/22   Honor Loh M, PA-C  fluconazole (DIFLUCAN) 150 MG tablet Take 1 tablet (150 mg total) by mouth daily. Take one tablet today, if symptoms continue to persist may take every 72 hours up to 2 additional doses. Patient not taking: Reported on 05/24/2022 04/12/22   Leath-Warren, Sadie Haber, NP  metroNIDAZOLE (FLAGYL) 500 MG tablet Take 1 tablet (500 mg total) by mouth 2 (two) times daily. Patient not taking: Reported on 05/24/2022 05/15/22   Mardella Layman, MD  ondansetron (ZOFRAN-ODT) 4 MG disintegrating tablet Take 1 tablet (4 mg total) by mouth every 8 (eight) hours as needed for nausea or  vomiting. 05/24/22   Honor Loh M, PA-C  Burr Medico 150-35 MCG/24HR transdermal patch 1 patch once a week. 11/03/21   [provider]      Allergies    Patient has no known allergies.    Review of Systems   Review of Systems  Respiratory:  Negative for shortness of breath.   Cardiovascular:  Negative for chest pain.  Gastrointestinal:  Negative for abdominal pain, nausea and vomiting.  Skin:  Positive for rash.    Physical Exam Updated Vital Signs BP 118/75 (BP Location: Right Arm)   Pulse 62   Temp 97.8 F (36.6 C) (Oral)   Resp 18   Ht 5\' 3"  (1.6 m)   Wt 75.2 kg   LMP 05/18/2022   SpO2 100%   BMI 29.37 kg/m  Physical Exam Vitals and nursing note reviewed.  Constitutional:      General: She is not in acute distress.    Appearance: She is not ill-appearing.  HENT:     Head: Atraumatic.     Mouth/Throat:     Comments: Tongue is nonswollen, airway intact Eyes:     Conjunctiva/sclera: Conjunctivae normal.  Cardiovascular:     Rate and Rhythm: Normal rate and regular rhythm.     Pulses: Normal pulses.     Heart sounds: No murmur heard. Pulmonary:     Effort: Pulmonary effort is normal. No respiratory distress.     Breath sounds: Normal breath sounds.     Comments: Speaking in  full complete sentences Abdominal:     General: Abdomen is flat. There is no distension.     Palpations: Abdomen is soft.     Tenderness: There is no abdominal tenderness.  Musculoskeletal:        General: Normal range of motion.     Cervical back: Normal range of motion.  Skin:    General: Skin is warm and dry.     Capillary Refill: Capillary refill takes less than 2 seconds.     Comments: Urticaria noted to the bilateral upper extremities, chest and up into the neck.  Neurological:     General: No focal deficit present.     Mental Status: She is alert.  Psychiatric:        Mood and Affect: Mood normal.     ED Results / Procedures / Treatments   Labs (all labs ordered are  listed, but only abnormal results are displayed) Labs Reviewed - No data to display  EKG None  Radiology No results found.  Procedures Procedures    Medications Ordered in ED Medications  methylPREDNISolone sodium succinate (SOLU-MEDROL) 125 mg/2 mL injection 125 mg (125 mg Intramuscular Given 05/30/22 1358)  diphenhydrAMINE (BENADRYL) capsule 25 mg (25 mg Oral Given 05/30/22 1357)  famotidine (PEPCID) tablet 20 mg (20 mg Oral Given 05/30/22 1357)    ED Course/ Medical Decision Making/ A&P                           Medical Decision Making Risk Prescription drug management.   Social determinants of health:  Social History   Socioeconomic History   Marital status: Single    Spouse name: Not on file   Number of children: Not on file   Years of education: Not on file   Highest education level: Not on file  Occupational History   Not on file  Tobacco Use   Smoking status: Never   Smokeless tobacco: Never  Vaping Use   Vaping Use: Never used  Substance and Sexual Activity   Alcohol use: Yes    Comment: occasional   Drug use: Not Currently    Types: Marijuana    Comment: last used "years ago"   Sexual activity: Yes    Birth control/protection: Patch  Other Topics Concern   Not on file  Social History Narrative   Not on file   Social Determinants of Health   Financial Resource Strain: Low Risk  (11/07/2021)   Overall Financial Resource Strain (CARDIA)    Difficulty of Paying Living Expenses: Not very hard  Food Insecurity: No Food Insecurity (11/07/2021)   Hunger Vital Sign    Worried About Running Out of Food in the Last Year: Never true    Ran Out of Food in the Last Year: Never true  Transportation Needs: No Transportation Needs (11/07/2021)   PRAPARE - Administrator, Civil Service (Medical): No    Lack of Transportation (Non-Medical): No  Physical Activity: Insufficiently Active (11/07/2021)   Exercise Vital Sign    Days of Exercise per  Week: 2 days    Minutes of Exercise per Session: 40 min  Stress: Stress Concern Present (11/07/2021)   [employer] of Occupational Health - Occupational Stress Questionnaire    Feeling of Stress : To some extent  Social Connections: Moderately Isolated (11/07/2021)   Social Connection and Isolation Panel [NHANES]    Frequency of Communication with Friends and Family: More than three times a  week    Frequency of Social Gatherings with Friends and Family: Twice a week    Attends Religious Services: 1 to 4 times per year    Active Member of [employer] or Organizations: No    Attends Banker Meetings: Never    Marital Status: Never married  Intimate Partner Violence: Not At Risk (11/07/2021)   Humiliation, Afraid, Rape, and Kick questionnaire    Fear of Current or Ex-Partner: No    Emotionally Abused: No    Physically Abused: No    Sexually Abused: No     Initial impression:  This patient presents to the ED for concern of pruritic rash upon waking this morning, this involves an extensive number of treatment options, and is a complaint that carries with it a high risk of complications and morbidity.   Differentials include allergic reaction, anaphylaxis, contact dermatitis, fungal infection.   Comorbidities affecting care:  None  Additional history obtained: None   Medicines ordered and prescription drug management:  I ordered medication including: Solu-Medrol 125 mg IM Benadryl 25 mg Pepcid 20 mg Reevaluation of the patient after these medicines showed that the patient improved I have reviewed the patients home medicines and have made adjustments as needed   ED Course/Re-evaluation: Presents in no acute distress and is nontoxic-appearing.  Vitals are without significant abnormality.  She is satting well on room air at 100% and her pulse is 62.  She is speaking in full complete sentences without increased respiratory effort.  She has an urticarial rash of the  bilateral upper extremities extending into the chest and neck.  Some rash noted to her cheeks as well.  Overall, patient's symptoms not consistent with anaphylaxis.  She was given Solu-Medrol 125 mg IM along with Benadryl and Pepcid here in the emergency department.  Patient is still having some itchiness, however she wishes to be discharged home about 40 minutes after administration of the steroids.  Will send home with 5 days of steroids.  Advised to continue with Benadryl and Pepcid.  We discussed return precautions and to come back if she has any difficulty breathing, shortness of breath or tongue swelling.  Patient expresses understanding and plan.  Disposition:  After consideration of the diagnostic results, physical exam, history and the patients response to treatment feel that the patent would benefit from discharge.   Allergic reaction: Plan and management as described above. Discharged home in good condition.  Final Clinical Impression(s) / ED Diagnoses Final diagnoses:  Allergic reaction, initial encounter    Rx / DC Orders ED Discharge Orders          Ordered    predniSONE (DELTASONE) 50 MG tablet  Daily        05/30/22 1435              Janell Quiet, [place] 05/30/22 1437    Terrilee Files, MD 05/30/22 1927

## 2022-05-30 NOTE — Discharge Instructions (Signed)
I have given you a shot of steroids here in the emergency department which should help with some of your symptoms.  It is going to take a couple of hours until full effect has kicked in.  You can continue to take Pepcid and Benadryl at home to help with your symptoms.  The Benadryl may make you drowsy, but will help decrease the itchiness and hives.  I have also sent you in a course of oral steroids that you will take for 5 days.  You can begin this tomorrow as you are given your first dose today.  If you develop any shortness of breath, difficulty breathing or worsening symptoms, please return to the emergency department.

## 2022-05-31 ENCOUNTER — Encounter: Payer: Self-pay | Admitting: Emergency Medicine

## 2022-05-31 ENCOUNTER — Ambulatory Visit
Admission: EM | Admit: 2022-05-31 | Discharge: 2022-05-31 | Disposition: A | Discharge status: Home / Self Care | Payer: Medicaid Other | Attending: Family Medicine | Admitting: Family Medicine

## 2022-05-31 DIAGNOSIS — L509 Urticaria, unspecified: Secondary | ICD-10-CM | POA: Diagnosis not present

## 2022-05-31 MED ORDER — METHYLPREDNISOLONE SODIUM SUCC 125 MG IJ SOLR
60.0000 mg | Freq: Once | INTRAMUSCULAR | Status: AC
  Administered 2022-05-31: 60 mg via INTRAMUSCULAR

## 2022-05-31 NOTE — ED Triage Notes (Signed)
Rash all over since yesterday that itches and burns.  Has tried benadryl, hydrocortisone and calamine without relief.  Was seen yesterday at ER and given steroid shot without relief.  Has not taken prednisone states the rash got worse.

## 2022-05-31 NOTE — Discharge Instructions (Signed)
Take Zyrtec and Pepcid twice daily until rash fully resolves, pick up your prednisone as soon as possible and start it.  If worsening go back to the emergency department

## 2022-05-31 NOTE — ED Provider Notes (Signed)
[facility]    CSN: [id number] Arrival date & time: 05/31/22  1329      History   Chief Complaint Chief Complaint  Patient presents with   Rash    HPI Brianna Patel is a 21 y.o. female.   Presenting today following up on emergency department visit yesterday for hives.  Was given 125 mg of IM Solu-Medrol, Benadryl, Pepcid and sent home with a prescription for prednisone which she has not yet picked up.  States the rash is worse today and across her entire body.  She denies abdominal pain, nausea, vomiting, diarrhea, throat swelling, wheezing, shortness of breath.  She is unsure what she was initially exposed to that has been causing symptoms.  Denies any history of allergic reactions like this.    Past Medical History:  Diagnosis Date   Closed lumbar vertebral fracture (HCC)     There are no problems to display for this patient.   Past Surgical History:  Procedure Laterality Date   INDUCED ABORTION      OB History     Gravida  1   Para      Term      Preterm      AB  1   Living         SAB      IAB      Ectopic      Multiple      Live Births               Home Medications    Prior to Admission medications   Medication Sig Start Date End Date Taking? Authorizing Provider  dicyclomine (BENTYL) 20 MG tablet Take 1 tablet (20 mg total) by mouth 2 (two) times daily. 05/24/22   Honor Loh M, PA-C  fluconazole (DIFLUCAN) 150 MG tablet Take 1 tablet (150 mg total) by mouth daily. Take one tablet today, if symptoms continue to persist may take every 72 hours up to 2 additional doses. Patient not taking: Reported on 05/24/2022 04/12/22   Leath-Warren, Sadie Haber, NP  metroNIDAZOLE (FLAGYL) 500 MG tablet Take 1 tablet (500 mg total) by mouth 2 (two) times daily. Patient not taking: Reported on 05/24/2022 05/15/22   Mardella Layman, MD  ondansetron (ZOFRAN-ODT) 4 MG disintegrating tablet Take 1 tablet (4 mg total) by mouth every 8 (eight) hours  as needed for nausea or vomiting. 05/24/22   Honor Loh M, PA-C  predniSONE (DELTASONE) 50 MG tablet Take 1 tablet (50 mg total) by mouth daily. 05/31/22   Janell Quiet, PA-C  XULANE 150-35 MCG/24HR transdermal patch 1 patch once a week. 11/03/21   [provider]    Family History History reviewed. No pertinent family history.  Social History Social History   Tobacco Use   Smoking status: Never   Smokeless tobacco: Never  Vaping Use   Vaping Use: Never used  Substance Use Topics   Alcohol use: Yes    Comment: occasional   Drug use: Not Currently    Types: Marijuana    Comment: last used "years ago"     Allergies   Patient has no known allergies.   Review of Systems Review of Systems Per HPI  Physical Exam Triage Vital Signs ED Triage Vitals  Enc Vitals Group     BP 05/31/22 1356 125/77     Pulse Rate 05/31/22 1356 69     Resp 05/31/22 1356 18     Temp 05/31/22 1356 98.8 F (37.1  C)     Temp Source 05/31/22 1356 Oral     SpO2 05/31/22 1356 97 %     Weight --      Height --      Head Circumference --      Peak Flow --      Pain Score 05/31/22 1357 7     Pain Loc --      Pain Edu? --      Excl. in GC? --    No data found.  Updated Vital Signs BP 125/77 (BP Location: Right Arm)   Pulse 69   Temp 98.8 F (37.1 C) (Oral)   Resp 18   LMP 05/18/2022   SpO2 97%   Visual Acuity Right Eye Distance:   Left Eye Distance:   Bilateral Distance:    Right Eye Near:   Left Eye Near:    Bilateral Near:     Physical Exam Vitals and nursing note reviewed.  Constitutional:      Appearance: Normal appearance. She is not ill-appearing.  HENT:     Head: Atraumatic.     Mouth/Throat:     Mouth: Mucous membranes are moist.     Pharynx: Oropharynx is clear. No posterior oropharyngeal erythema.  Eyes:     Extraocular Movements: Extraocular movements intact.     Conjunctiva/sclera: Conjunctivae normal.  Cardiovascular:     Rate and Rhythm:  Normal rate and regular rhythm.     Heart sounds: Normal heart sounds.  Pulmonary:     Effort: Pulmonary effort is normal.     Breath sounds: Normal breath sounds. No wheezing or rales.     Comments: In no acute distress, breathing comfortably on room air, speaking in full sentences Musculoskeletal:        General: Normal range of motion.     Cervical back: Normal range of motion and neck supple.  Skin:    General: Skin is warm.     Findings: Rash present.     Comments: Diffuse erythematous urticarial rash across entire body  Neurological:     Mental Status: She is alert and oriented to person, place, and time.  Psychiatric:        Mood and Affect: Mood normal.        Thought Content: Thought content normal.        Judgment: Judgment normal.    UC Treatments / Results  Labs (all labs ordered are listed, but only abnormal results are displayed) Labs Reviewed - No data to display  EKG   Radiology No results found.  Procedures Procedures (including critical care time)  Medications Ordered in UC Medications  methylPREDNISolone sodium succinate (SOLU-MEDROL) 125 mg/2 mL injection 60 mg (has no administration in time range)    Initial Impression / Assessment and Plan / UC Course  I have reviewed the triage vital signs and the nursing notes.  Pertinent labs & imaging results that were available during my care of the patient were reviewed by me and considered in my medical decision making (see chart for details).     We will provide smaller additional dose of IM Solu-Medrol, continue antihistamine and Pepcid twice daily, pick up the prednisone and start as soon as possible.  Discussed supportive care, strict return precautions for worsening symptoms.  Work note given.  Final Clinical Impressions(s) / UC Diagnoses   Final diagnoses:  Hives     Discharge Instructions      Take Zyrtec and Pepcid twice daily until rash fully  resolves, pick up your prednisone as soon as  possible and start it.  If worsening go back to the emergency department    ED Prescriptions   None    PDMP not reviewed this encounter.   Particia Nearing, [place] 05/31/22 1433

## 2022-06-03 ENCOUNTER — Ambulatory Visit (INDEPENDENT_AMBULATORY_CARE_PROVIDER_SITE_OTHER): Payer: Medicaid Other | Admitting: Allergy & Immunology

## 2022-06-03 ENCOUNTER — Encounter: Payer: Self-pay | Admitting: Allergy & Immunology

## 2022-06-03 VITALS — BP 120/78 | HR 70 | Temp 98.4°F | Resp 20 | Ht 63.0 in | Wt 172.2 lb

## 2022-06-03 DIAGNOSIS — T7840XA Allergy, unspecified, initial encounter: Secondary | ICD-10-CM | POA: Insufficient documentation

## 2022-06-03 DIAGNOSIS — L508 Other urticaria: Secondary | ICD-10-CM

## 2022-06-03 DIAGNOSIS — T7840XD Allergy, unspecified, subsequent encounter: Secondary | ICD-10-CM

## 2022-06-03 MED ORDER — FAMOTIDINE 10 MG PO TABS: 10.0000 mg | ORAL_TABLET | Freq: Two times a day (BID) | ORAL | 5 refills | Status: DC

## 2022-06-03 MED ORDER — TRIAMCINOLONE ACETONIDE 0.025 % EX OINT: 1.0000 | TOPICAL_OINTMENT | Freq: Two times a day (BID) | CUTANEOUS | 5 refills | Status: DC

## 2022-06-03 NOTE — Progress Notes (Signed)
NEW PATIENT  Date of Service/Encounter:  06/03/22  Consult requested by: [facility] [place]   Assessment:   Allergic reaction - unknown trigger   Social stressor - boyfriend passed away in 2022/04/24  Plan/Recommendations:    1. Allergic reaction - unknown trigger - Your history does not have any "red flags" such as fevers, joint pains, or permanent skin changes that would be concerning for a more serious cause of hives.  - We could not do testing on the skin since your reaction was too recent, but we are going to get some blood work.  - We will get some labs to rule out serious causes of hives: alpha gal panel, complete blood count, tryptase level, chronic urticaria panel, CMP, ESR, and CRP. - Chronic hives are often times a self limited process and will "burn themselves out" over 6-12 months, although this is not always the case.  - In the meantime, start suppressive dosing of antihistamines:   - Morning: Zyrtec (cetirizine) 10-20mg  (two tablets)  - Evening: Zyrtec (cetirizine) 10-20mg  (two tablets)  - If the above is not working, try adding: Pepcid (famotidine) 20mg  - You can change this dosing at home, decreasing the dose as needed or increasing the dosing as needed.  - Add on triamcinolone ointment twice daily applied to the skin lesions.   2. Return in about 2 weeks (around 06/17/2022).   This note in its entirety was forwarded to the Provider who requested this consultation.  Subjective:   Brianna Patel is a 21 y.o. female presenting today for evaluation of  Chief Complaint  Patient presents with   Allergic Reaction    Break out all over body, huge hives and bumps started Tuesday received steroid shot and benadryl, it helped the moment, but after she went to sleep and woke back up the next morning it was worse than what it was before.     Brianna Patel has a history of the following: Patient Active Problem List   Diagnosis Date Noted   Allergic  reaction 06/03/2022    History obtained from: chart review and patient.  Brianna Patel was referred by [employer], [facility].     Rumor is a 21 y.o. female presenting for an evaluation of urticaria and allergic reactions . She went to the ED on July 13th and July 14th. She got steroids and antihistamines. Per the ED note, she had one episode of diarrhea. Symptoms first started this past Wednesday. She went home and went to sleep. This was around the morning when she first woke up. She was in her shower  and thought that the water was too hot. She got out and she had hives over her entire body. She went to bed and then she woke up on Thursday with worsening symptoms. She went back to Urgent Care and she got a steroid injection. She was prescribed steroids and was told to take a Benadryl when she got home. The same thing happened and it has cleared over the course of the weekend. She is currently on a steroid taper. She finished it after five days. She was not sent home with an EpiPen despite her throat being swollen as well. She was told that the steroids should help with it.   She is very repetitive with her foods. There are no new exposures. She does work at a call center. 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 She had some grapes, a slushie and [employer] chips; she eats this all of the time without a problem. She denies changes to her laundry detergent. There are no new exposures at all. She eats red meat fairly infrequently.   She was told to take Benadryl and Pepcid. She denies sedation from her cetirizine. She is open to taking it regularly.   She has been very stressed. Her boyfriend died from an overdose less than two months ago. She does report some stress over the cost of school and whatnot.   Otherwise, there is no history of other atopic diseases, including asthma, food allergies, drug allergies,  environmental allergies, stinging insect allergies, or contact dermatitis. There is no significant infectious history. Vaccinations are up to date.    Past Medical History: Patient Active Problem List   Diagnosis Date Noted   Allergic reaction 06/03/2022    Medication List:  Allergies as of 06/03/2022   No Known Allergies      Medication List        Accurate as of June 03, 2022  4:27 PM. If you have any questions, ask your nurse or doctor.          STOP taking these medications    dicyclomine 20 MG tablet Commonly known as: BENTYL Stopped by: Valentina Shaggy, MD   fluconazole 150 MG tablet Commonly known as: DIFLUCAN Stopped by: Valentina Shaggy, MD   metroNIDAZOLE 500 MG tablet Commonly known as: FLAGYL Stopped by: Valentina Shaggy, MD   ondansetron 4 MG disintegrating tablet Commonly known as: ZOFRAN-ODT Stopped by: Valentina Shaggy, MD   predniSONE 50 MG tablet Commonly known as: DELTASONE Stopped by: Valentina Shaggy, MD       TAKE these medications    famotidine 10 MG tablet Commonly known as: PEPCID Take 1 tablet (10 mg total) by mouth 2 (two) times daily. Started by: Valentina Shaggy, MD   triamcinolone 0.025 % ointment Commonly known as: KENALOG Apply 1 Application topically 2 (two) times daily. Started by: Valentina Shaggy, MD   Marilu Favre [phone] MCG/24HR transdermal patch Generic drug: norelgestromin-ethinyl estradiol 1 patch once a week.        Birth History: non-contributory  Developmental History: non-contributory  Past Surgical History: Past Surgical History:  Procedure Laterality Date   INDUCED ABORTION       Family History: Family History  Problem Relation Age of Onset   Eczema Mother    Eczema Sister    Allergic rhinitis Neg Hx    Asthma Neg Hx    Urticaria Neg Hx      Social History: Brianna Patel lives at home with her grandmother.  Seen in a house.  There is wood in the main living areas and  carpeting in the bedroom.  She has electric heating and central cooling.  There are no animals inside or outside of the home.  There are no dust mite covers on the bedding.  There is tobacco exposure in the house as well as a car.  There is no fume or chemical exposure.  They do not use a HEPA filter.  They do not live near an interstate or industrial area.   Review of Systems  Constitutional: Negative.  Negative for chills, fever, malaise/fatigue and weight loss.  HENT: Negative.  Negative for congestion, ear discharge and ear pain.   Eyes:  Negative for pain, discharge and redness.  Respiratory:  Negative for cough,  sputum production, shortness of breath and wheezing.   Cardiovascular: Negative.  Negative for chest pain and palpitations.  Gastrointestinal:  Negative for abdominal pain, heartburn, nausea and vomiting.  Skin:  Positive for itching and rash.  Neurological:  Negative for dizziness and headaches.  Endo/Heme/Allergies:  Negative for environmental allergies. Does not bruise/bleed easily.       Objective:   Blood pressure 120/78, pulse 70, temperature 98.4 F (36.9 C), temperature source Temporal, resp. rate 20, height $RemoveBe'5\' 3"'ewIdrnKeP$  (1.6 m), weight 172 lb 3.2 oz (78.1 kg), last menstrual period 05/18/2022, SpO2 99 %. Body mass index is 30.5 kg/m.     Physical Exam Constitutional:      Appearance: She is well-developed.  HENT:     Head: Normocephalic and atraumatic.     Right Ear: Tympanic membrane, ear canal and external ear normal. No drainage, swelling or tenderness. Tympanic membrane is not injected, scarred, erythematous, retracted or bulging.     Left Ear: Tympanic membrane, ear canal and external ear normal. No drainage, swelling or tenderness. Tympanic membrane is not injected, scarred, erythematous, retracted or bulging.     Nose: No nasal deformity, septal deviation, mucosal edema or rhinorrhea.     Right Turbinates: Enlarged, swollen and pale.     Left Turbinates:  Enlarged, swollen and pale.     Right Sinus: No maxillary sinus tenderness or frontal sinus tenderness.     Left Sinus: No maxillary sinus tenderness or frontal sinus tenderness.     Mouth/Throat:     Mouth: Mucous membranes are not pale and not dry.     Pharynx: Uvula midline.  Eyes:     General:        Right eye: No discharge.        Left eye: No discharge.     Conjunctiva/sclera: Conjunctivae normal.     Right eye: Right conjunctiva is not injected. No chemosis.    Left eye: Left conjunctiva is not injected. No chemosis.    Pupils: Pupils are equal, round, and reactive to light.  Cardiovascular:     Rate and Rhythm: Normal rate and regular rhythm.     Heart sounds: Normal heart sounds.  Pulmonary:     Effort: Pulmonary effort is normal. No tachypnea, accessory muscle usage or respiratory distress.     Breath sounds: Normal breath sounds. No wheezing, rhonchi or rales.  Chest:     Chest wall: No tenderness.  Abdominal:     Tenderness: There is no abdominal tenderness. There is no guarding or rebound.  Lymphadenopathy:     Head:     Right side of head: No submandibular, tonsillar or occipital adenopathy.     Left side of head: No submandibular, tonsillar or occipital adenopathy.     Cervical: No cervical adenopathy.  Skin:    General: Skin is warm.     Capillary Refill: Capillary refill takes less than 2 seconds.     Coloration: Skin is not pale.     Findings: Rash present. No abrasion, erythema or petechiae. Rash is urticarial. Rash is not papular or vesicular.     Comments: There are some urticarial lesions over the bilateral arms.   Neurological:     Mental Status: She is alert.  Psychiatric:        Behavior: Behavior is cooperative.      Diagnostic studies: labs sent instead           Salvatore Marvel, MD [facility] [place] [place]

## 2022-06-03 NOTE — Patient Instructions (Addendum)
1. Allergic reaction - unknown trigger - Your history does not have any "red flags" such as fevers, joint pains, or permanent skin changes that would be concerning for a more serious cause of hives.  - We could not do testing on the skin since your reaction was too recent, but we are going to get some blood work.  - We will get some labs to rule out serious causes of hives: alpha gal panel, complete blood count, tryptase level, chronic urticaria panel, CMP, ESR, and CRP. - Chronic hives are often times a self limited process and will "burn themselves out" over 6-12 months, although this is not always the case.  - In the meantime, start suppressive dosing of antihistamines:   - Morning: Zyrtec (cetirizine) 10-20mg  (two tablets)  - Evening: Zyrtec (cetirizine) 10-20mg  (two tablets)  - If the above is not working, try adding: Pepcid (famotidine) 20mg  - You can change this dosing at home, decreasing the dose as needed or increasing the dosing as needed.  - Add on triamcinolone ointment twice daily applied to the skin lesions.   2. Return in about 2 weeks (around 06/17/2022).    Please inform us of any Emergency Department visits, hospitalizations, or changes in symptoms. Call us before going to the ED for breathing or allergy symptoms since we might be able to fit you in for a sick visit. Feel free to contact us anytime with any questions, problems, or concerns.  It was a pleasure to meet you today!  Websites that have reliable patient information: 1. American Academy of Asthma, Allergy, and Immunology: www.aaaai.org 2. Food Allergy Research and Education (FARE): foodallergy.org 3. Mothers of Asthmatics: http://www.asthmacommunitynetwork.org 4. American College of Allergy, Asthma, and Immunology: www.acaai.org   COVID-19 Vaccine Information can be found at: ShippingScam.co.uk For questions related to vaccine distribution or appointments,  please email vaccine@[email or web address] [email or web address] or call [phone].   We realize that you might be concerned about having an allergic reaction to the COVID19 vaccines. To help with that concern, WE ARE OFFERING THE COVID19 VACCINES IN OUR OFFICE! Ask the front desk for dates!     "Like" [place] on Facebook and Instagram for our latest updates!      A healthy democracy works best when [employer] participate! Make sure you are registered to vote! If you have moved or changed any of your contact information, you will need to get this updated before voting!  In some cases, you MAY be able to register to vote online: [email or web address]

## 2022-06-28 LAB — ALPHA-GAL PANEL
Allergen Lamb IgE: <0.1 kU/L
Beef IgE: <0.1 kU/L
IgE (Immunoglobulin E), Serum: 34 IU/mL (ref 6–495)
O215-IgE Alpha-Gal: <0.1 kU/L
Pork IgE: <0.1 kU/L

## 2022-06-28 LAB — CBC WITH DIFFERENTIAL
Basophils Absolute: 0 10*3/uL (ref 0.0–0.2)
Basos: 1 %
EOS (ABSOLUTE): 0.1 10*3/uL (ref 0.0–0.4)
Eos: 2 %
Hematocrit: 40.5 % (ref 34.0–46.6)
Hemoglobin: 13 g/dL (ref 11.1–15.9)
Immature Grans (Abs): 0 10*3/uL (ref 0.0–0.1)
Immature Granulocytes: 0 %
Lymphocytes Absolute: 2.2 10*3/uL (ref 0.7–3.1)
Lymphs: 39 %
MCH: 25.9 pg — ABNORMAL LOW (ref 26.6–33.0)
MCHC: 32.1 g/dL (ref 31.5–35.7)
MCV: 81 fL (ref 79–97)
Monocytes Absolute: 0.4 10*3/uL (ref 0.1–0.9)
Monocytes: 8 %
Neutrophils Absolute: 2.9 10*3/uL (ref 1.4–7.0)
Neutrophils: 50 %
RBC: 5.02 x10E6/uL (ref 3.77–5.28)
RDW: 14.6 % (ref 11.7–15.4)
WBC: 5.6 10*3/uL (ref 3.4–10.8)

## 2022-06-28 LAB — CMP14+EGFR
ALT: 10 IU/L (ref 0–32)
AST: 12 IU/L (ref 0–40)
Albumin/Globulin Ratio: 1.4 (ref 1.2–2.2)
Albumin: 3.9 g/dL — ABNORMAL LOW (ref 4.0–5.0)
Alkaline Phosphatase: 47 IU/L (ref 44–121)
BUN/Creatinine Ratio: 12 (ref 9–23)
BUN: 8 mg/dL (ref 6–20)
Bilirubin Total: 0.2 mg/dL (ref 0.0–1.2)
CO2: 17 mmol/L — ABNORMAL LOW (ref 20–29)
Calcium: 9.1 mg/dL (ref 8.7–10.2)
Chloride: 107 mmol/L — ABNORMAL HIGH (ref 96–106)
Creatinine, Ser: 0.67 mg/dL (ref 0.57–1.00)
Globulin, Total: 2.7 g/dL (ref 1.5–4.5)
Glucose: 90 mg/dL (ref 70–99)
Potassium: 4.3 mmol/L (ref 3.5–5.2)
Sodium: 137 mmol/L (ref 134–144)
Total Protein: 6.6 g/dL (ref 6.0–8.5)
eGFR: 127 mL/min/{1.73_m2} (ref >59)

## 2022-06-28 LAB — ALLERGENS W/COMP RFLX AREA 2
Alternaria Alternata IgE: <0.1 kU/L
Aspergillus Fumigatus IgE: <0.1 kU/L
Bermuda Grass IgE: <0.1 kU/L
Cedar, Mountain IgE: <0.1 kU/L
Cladosporium Herbarum IgE: <0.1 kU/L
Cockroach, German IgE: <0.1 kU/L
Common Silver Birch IgE: <0.1 kU/L
Cottonwood IgE: <0.1 kU/L
D Farinae IgE: <0.1 kU/L
D Pteronyssinus IgE: <0.1 kU/L
E001-IgE Cat Dander: <0.1 kU/L
E005-IgE Dog Dander: <0.1 kU/L
Elm, American IgE: <0.1 kU/L
Johnson Grass IgE: <0.1 kU/L
Maple/Box Elder IgE: <0.1 kU/L
Mouse Urine IgE: <0.1 kU/L
Oak, White IgE: <0.1 kU/L
Pecan, Hickory IgE: <0.1 kU/L
Penicillium Chrysogen IgE: <0.1 kU/L
Pigweed, Rough IgE: <0.1 kU/L
Ragweed, Short IgE: <0.1 kU/L
Sheep Sorrel IgE Qn: <0.1 kU/L
Timothy Grass IgE: <0.1 kU/L
White Mulberry IgE: <0.1 kU/L

## 2022-06-28 LAB — TRYPTASE: Tryptase: 3.3 ug/L (ref 2.2–13.2)

## 2022-06-28 LAB — ANTINUCLEAR ANTIBODIES, IFA: ANA Titer 1: NEGATIVE

## 2022-06-28 LAB — CHRONIC URTICARIA: cu index: <1 (ref <10)

## 2022-06-28 LAB — SEDIMENTATION RATE: Sed Rate: 25 mm/hr (ref 0–32)

## 2022-06-28 LAB — THYROID ANTIBODIES
Thyroglobulin Antibody: <1 IU/mL (ref 0.0–0.9)
Thyroperoxidase Ab SerPl-aCnc: <9 IU/mL (ref 0–34)

## 2022-06-28 LAB — C-REACTIVE PROTEIN: CRP: 2 mg/L (ref 0–10)
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
## 2022-10-13 ENCOUNTER — Ambulatory Visit
Admission: RE | Admit: 2022-10-13 | Discharge: 2022-10-13 | Disposition: A | Discharge status: Home / Self Care | Payer: Medicaid Other | Source: Ambulatory Visit | Attending: Nurse Practitioner | Admitting: Nurse Practitioner

## 2022-10-13 VITALS — BP 113/72 | HR 68 | Temp 98.8°F | Resp 14

## 2022-10-13 DIAGNOSIS — Z113 Encounter for screening for infections with a predominantly sexual mode of transmission: Secondary | ICD-10-CM | POA: Diagnosis present

## 2022-10-13 NOTE — Discharge Instructions (Addendum)
Your results should be available within the next 48 to 72 hours.  If you have access to MyChart, you will be able to see the results there.  If your results are positive, you will be contacted to discuss treatment. Increase condom use Follow-up as needed.  

## 2022-10-13 NOTE — ED Provider Notes (Signed)
[facility]    CSN: [id number] Arrival date & time: 10/13/22  1159      History   Chief Complaint Chief Complaint  Patient presents with   Exposure to STD    Entered by patient   Appointment    1300    HPI Brianna Patel is a 21 y.o. female.   The history is provided by the patient.   Patient presents for STI testing.  Patient states that she recently tested positive for chlamydia.  States that she was prescribed doxycycline for 7 days, which she just completed.  Patient denies vaginal discharge, vaginal odor, vaginal itching, urinary frequency, urgency, hesitancy, hematuria, or abdominal pain.  Reports that she would like to get retested to see if the infection has cleared.  She reports her last menstrual cycle was approximately 2 weeks ago, she is on the [place] patch for birth control.  She reports 1 female partner in the past 90 days, no other history of STIs. Past Medical History:  Diagnosis Date   Closed lumbar vertebral fracture (HCC)    Urticaria     Patient Active Problem List   Diagnosis Date Noted   Allergic reaction 06/03/2022    Past Surgical History:  Procedure Laterality Date   INDUCED ABORTION      OB History     Gravida  1   Para      Term      Preterm      AB  1   Living         SAB      IAB      Ectopic      Multiple      Live Births               Home Medications    Prior to Admission medications   Medication Sig Start Date End Date Taking? Authorizing Provider  famotidine (PEPCID) 10 MG tablet Take 1 tablet (10 mg total) by mouth 2 (two) times daily. 06/03/22   Alfonse Spruce, MD  triamcinolone (KENALOG) 0.025 % ointment Apply 1 Application topically 2 (two) times daily. 06/03/22   Alfonse Spruce, MD  [facility] 150-35 MCG/24HR transdermal patch 1 patch once a week. 11/03/21   [provider]    Family History Family History  Problem Relation Age of Onset   Eczema Mother    Eczema  Sister    Allergic rhinitis Neg Hx    Asthma Neg Hx    Urticaria Neg Hx     Social History Social History   Tobacco Use   Smoking status: Never   Smokeless tobacco: Never  Vaping Use   Vaping Use: Never used  Substance Use Topics   Alcohol use: Yes    Comment: occasional   Drug use: Not Currently    Types: Marijuana    Comment: last used "years ago"     Allergies   Patient has no known allergies.   Review of Systems Review of Systems Per HPI  Physical Exam Triage Vital Signs ED Triage Vitals  Enc Vitals Group     BP 10/13/22 1222 113/72     Pulse Rate 10/13/22 1222 68     Resp 10/13/22 1222 14     Temp 10/13/22 1222 98.8 F (37.1 C)     Temp Source 10/13/22 1222 Oral     SpO2 10/13/22 1222 99 %     Weight --      Height --  Head Circumference --      Peak Flow --      Pain Score 10/13/22 1220 0     Pain Loc --      Pain Edu? --      Excl. in GC? --    No data found.  Updated Vital Signs BP 113/72 (BP Location: Right Arm)   Pulse 68   Temp 98.8 F (37.1 C) (Oral)   Resp 14   LMP  (Within Weeks) Comment: 2 weeks  SpO2 99%   Visual Acuity Right Eye Distance:   Left Eye Distance:   Bilateral Distance:    Right Eye Near:   Left Eye Near:    Bilateral Near:     Physical Exam Vitals and nursing note reviewed.  Constitutional:      Appearance: Normal appearance. She is not toxic-appearing.  HENT:     Head: Normocephalic.  Eyes:     Extraocular Movements: Extraocular movements intact.     Pupils: Pupils are equal, round, and reactive to light.  Cardiovascular:     Rate and Rhythm: Normal rate and regular rhythm.     Pulses: Normal pulses.     Heart sounds: Normal heart sounds.  Pulmonary:     Effort: Pulmonary effort is normal.     Breath sounds: Normal breath sounds.  Abdominal:     General: Bowel sounds are normal.     Palpations: Abdomen is soft.  Musculoskeletal:     Cervical back: Normal range of motion.  Skin:    General:  Skin is warm and dry.  Neurological:     General: No focal deficit present.     Mental Status: She is alert and oriented to person, place, and time.  Psychiatric:        Mood and Affect: Mood normal.        Behavior: Behavior normal.      UC Treatments / Results  Labs (all labs ordered are listed, but only abnormal results are displayed) Labs Reviewed  HIV ANTIBODY (ROUTINE TESTING W REFLEX)  RPR    EKG   Radiology No results found.  Procedures Procedures (including critical care time)  Medications Ordered in UC Medications - No data to display  Initial Impression / Assessment and Plan / UC Course  I have reviewed the triage vital signs and the nursing notes.  Pertinent labs & imaging results that were available during my care of the patient were reviewed by me and considered in my medical decision making (see chart for details).  Patient presents for STI testing after recent exposure.  Patient was treated with doxycycline, completed the course of treatment.  She currently is not having any symptoms at this time.  Patient is also requesting HIV/RPR testing.  Patient was advised to increase her condom use.  Patient was advised that she will be contacted if her results are positive.  Patient verbalizes understanding.  All questions were answered.  Patient is stable for discharge. Final Clinical Impressions(s) / UC Diagnoses   Final diagnoses:  Screening examination for sexually transmitted disease     Discharge Instructions      Your results should be available within the next 48 to 72 hours.  If you have access to MyChart, you will be able to see the results there.  If your results are positive, you will be contacted to discuss treatment. Increase condom use Follow-up as needed.      ED Prescriptions   None    PDMP  not reviewed this encounter.   Abran Cantor, NP 10/13/22 1237

## 2022-10-13 NOTE — ED Triage Notes (Signed)
Pt presents for STD's test after exposure.

## 2022-10-14 LAB — CERVICOVAGINAL ANCILLARY ONLY
Bacterial Vaginitis (gardnerella): NEGATIVE
Candida Glabrata: NEGATIVE
Candida Vaginitis: NEGATIVE
Chlamydia: NEGATIVE
Comment: NEGATIVE
Comment: NEGATIVE
Comment: NEGATIVE
Comment: NEGATIVE
Comment: NEGATIVE
Comment: NORMAL
Neisseria Gonorrhea: NEGATIVE
Trichomonas: NEGATIVE

## 2022-10-15 LAB — RPR: RPR Ser Ql: NONREACTIVE

## 2022-10-15 LAB — HIV ANTIBODY (ROUTINE TESTING W REFLEX): HIV Screen 4th Generation wRfx: NONREACTIVE

## 2022-11-19 NOTE — Progress Notes (Signed)
[address], [address] [place] [address] Dept: [phone]  FOLLOW UP NOTE  Patient ID: Brianna Patel, female    DOB: Jul 06, 2001  Age: 22 y.o. MRN: [id number] Date of Office Visit: 11/20/2022  Assessment  Chief Complaint: Allergy Testing and Rash (Breakouts of rash on her face- noticed it about 2/3 days ago- itchy )  HPI Brianna Patel is a 22 year old female who presents to the clinic for follow-up visit.  She was last seen in this clinic on 06/03/2022 by Dr. Ernst Bowler for evaluation of urticaria.  At that time, she had a large workup that was largely normal.  At today's visit, she reports that she began to break out in hives on her neck and chest 3 days ago.  She reports these are raised, red, and pruritic areas.  She denies new foods, medications, personal care products, illness, or insect stings.  She denies concomitant cardiopulmonary or gastrointestinal symptoms. She reports that she experiences intermittent hives occurring about once a month and lasting for about 4-7 days each time. She reports these areas completely resolve without bruising or hyperpigmentation. She has used triamcinolone ointment with some relief of itch. She is not currently taking any antihistamines. Chronic rhinitis is reported as moderately well controlled with occasional nasal congestion and clear rhinorrhea for which she is not currently using any medical intervention. Her environmental testing via lab was negative on 06/17/2022. She reports that she is interested in testing the top most allergenic foods to complete the testing for urticaria.  She is not currently avoiding any foods, however, reports that she has experienced throat closing 1 time several years ago.  Her current medications are listed in the chart.    Drug Allergies:  No Known Allergies  Physical Exam: BP 108/74   Pulse 86   Temp 97.8 F (36.6 C)   Resp 16   Ht 5\' 4"  (1.626 m)   Wt 186 lb 4 oz (84.5 kg)   SpO2 95%   BMI 31.97  kg/m    Physical Exam Vitals reviewed.  Constitutional:      Appearance: Normal appearance.  HENT:     Head: Normocephalic and atraumatic.     Right Ear: Tympanic membrane normal.     Left Ear: Tympanic membrane normal.     Nose:     Comments: Bilateral nares edematous and pale with clear nasal drainage noted. Pharynx normal. Ears normal. Eyes normal.    Mouth/Throat:     Pharynx: Oropharynx is clear.  Eyes:     Conjunctiva/sclera: Conjunctivae normal.  Cardiovascular:     Rate and Rhythm: Normal rate and regular rhythm.     Heart sounds: Normal heart sounds. No murmur heard. Pulmonary:     Effort: Pulmonary effort is normal.     Breath sounds: Normal breath sounds.     Comments: Lungs clear to auscultation Musculoskeletal:        General: Normal range of motion.     Cervical back: Normal range of motion and neck supple.  Skin:    General: Skin is warm.     Comments: Scattered raised red areas on her left lower jaw and across her chest. No open areas or drainage noted.   Neurological:     Mental Status: She is alert and oriented to person, place, and time.  Psychiatric:        Mood and Affect: Mood normal.        Behavior: Behavior normal.  Thought Content: Thought content normal.        Judgment: Judgment normal.     Assessment and Plan: 1. Chronic urticaria   2. Chronic rhinitis   3. Allergic reaction, subsequent encounter     Meds ordered this encounter  Medications   cetirizine (ZYRTEC) 10 MG tablet    Sig: Take 1 tablet (10 mg total) by mouth 2 (two) times daily.    Dispense:  60 tablet    Refill:  3   famotidine (PEPCID) 10 MG tablet    Sig: Take 1 tablet (10 mg total) by mouth 2 (two) times daily.    Dispense:  60 tablet    Refill:  5   fluticasone (FLONASE) 50 MCG/ACT nasal spray    Sig: Place 2 sprays into both nostrils daily as needed for allergies or rhinitis.    Dispense:  16 g    Refill:  2   triamcinolone (KENALOG) 0.025 % ointment     Sig: Apply 1 Application topically 2 (two) times daily.    Dispense:  80 g    Refill:  5   desonide (DESOWEN) 0.05 % ointment    Sig: Apply 1 Application topically 2 (two) times daily.    Dispense:  15 g    Refill:  0    Patient Instructions  Hives (urticaria) Take the least amount of medications while remaining hive free Cetirizine (Zyrtec) 10mg  twice a day and famotidine (Pepcid) 20 mg twice a day. If no symptoms for 7-14 days then decrease to. Cetirizine (Zyrtec) 10mg  twice a day and famotidine (Pepcid) 20 mg once a day.  If no symptoms for 7-14 days then decrease to. Cetirizine (Zyrtec) 10mg  twice a day.  If no symptoms for 7-14 days then decrease to. Cetirizine (Zyrtec) 10mg  once a day. May use Benadryl (diphenhydramine) as needed for breakthrough hives       If symptoms return, then step up dosage Keep a detailed symptom journal including foods eaten, contact with allergens, medications taken, weather changes.  For itchy areas on your face begin desonide 0.05% ointment For itchy areas under your face, continue triamcinolone 0.1% ointment Do not use these medications longer than 2 weeks in a row Consider Xolair injections for control of hives (handout provided)  Chronic rhinitis Your environmental allergy testing was negative at your last visit.  Continue cetirizine as needed for a runny nose or itch Begin Flonase 2 sprays in each nostril once a day as needed for a stuffy nose Consider saline nasal rinses as needed for nasal symptoms. Use this before any medicated nasal sprays for best result  Food allergy An order has been placed to help [place] determine your food allergies We will call you when the results become available  Call the clinic if this treatment plan is not working well for you.  Follow up in 2 months or sooner if needed.   Return in about 2 months (around 01/19/2023), or if symptoms worsen or fail to improve.    Thank you for the opportunity to care for this  patient.  Please do not hesitate to contact me with questions.  Gareth Morgan, FNP [facility] [place]

## 2022-11-19 NOTE — Patient Instructions (Incomplete)
Hives (urticaria) Take the least amount of medications while remaining hive free Cetirizine (Zyrtec) 10mg  twice a day and famotidine (Pepcid) 20 mg twice a day. If no symptoms for 7-14 days then decrease to. Cetirizine (Zyrtec) 10mg  twice a day and famotidine (Pepcid) 20 mg once a day.  If no symptoms for 7-14 days then decrease to. Cetirizine (Zyrtec) 10mg  twice a day.  If no symptoms for 7-14 days then decrease to. Cetirizine (Zyrtec) 10mg  once a day. May use Benadryl (diphenhydramine) as needed for breakthrough hives       If symptoms return, then step up dosage Keep a detailed symptom journal including foods eaten, contact with allergens, medications taken, weather changes.  For itchy areas on your face begin desonide 0.05% ointment For itchy areas under your face, continue triamcinolone 0.1% ointment Do not use these medications longer than 2 weeks in a row Consider Xolair injections for control of hives (handout provided)  Chronic rhinitis Your environmental allergy testing was negative at your last visit.  Continue cetirizine as needed for a runny nose or itch Begin Flonase 2 sprays in each nostril once a day as needed for a stuffy nose Consider saline nasal rinses as needed for nasal symptoms. Use this before any medicated nasal sprays for best result  Food allergy An order has been placed to help [place] determine your food allergies We will call you when the results become available  Call the clinic if this treatment plan is not working well for you.  Follow up in 2 months or sooner if needed.

## 2022-11-20 ENCOUNTER — Other Ambulatory Visit: Payer: Self-pay

## 2022-11-20 ENCOUNTER — Encounter: Payer: Self-pay | Admitting: Family Medicine

## 2022-11-20 ENCOUNTER — Ambulatory Visit (INDEPENDENT_AMBULATORY_CARE_PROVIDER_SITE_OTHER): Payer: Medicaid Other | Admitting: Family Medicine

## 2022-11-20 VITALS — BP 108/74 | HR 86 | Temp 97.8°F | Resp 16 | Ht 64.0 in | Wt 186.2 lb

## 2022-11-20 DIAGNOSIS — J310 Chronic rhinitis: Secondary | ICD-10-CM | POA: Insufficient documentation

## 2022-11-20 DIAGNOSIS — L508 Other urticaria: Secondary | ICD-10-CM

## 2022-11-20 DIAGNOSIS — T7840XD Allergy, unspecified, subsequent encounter: Secondary | ICD-10-CM | POA: Diagnosis not present

## 2022-11-20 MED ORDER — FLUTICASONE PROPIONATE 50 MCG/ACT NA SUSP: 2.0000 | Freq: Every day | NASAL | 2 refills | Status: DC | PRN

## 2022-11-20 MED ORDER — CETIRIZINE HCL 10 MG PO TABS: 10.0000 mg | ORAL_TABLET | Freq: Two times a day (BID) | ORAL | 3 refills | Status: AC

## 2022-11-20 MED ORDER — FAMOTIDINE 10 MG PO TABS: 10.0000 mg | ORAL_TABLET | Freq: Two times a day (BID) | ORAL | 5 refills | Status: AC

## 2022-11-20 MED ORDER — TRIAMCINOLONE ACETONIDE 0.025 % EX OINT: 1.0000 | TOPICAL_OINTMENT | Freq: Two times a day (BID) | CUTANEOUS | 5 refills | Status: AC

## 2022-11-20 MED ORDER — DESONIDE 0.05 % EX OINT: 1.0000 | TOPICAL_OINTMENT | Freq: Two times a day (BID) | CUTANEOUS | 0 refills | Status: AC

## 2022-11-23 LAB — FOOD ALLERGY PROFILE
Allergen Corn, IgE: <0.1 kU/L
Clam IgE: <0.1 kU/L
Codfish IgE: <0.1 kU/L
Egg White IgE: <0.1 kU/L
Milk IgE: <0.1 kU/L
Peanut IgE: <0.1 kU/L
Scallop IgE: <0.1 kU/L
Sesame Seed IgE: <0.1 kU/L
Shrimp IgE: <0.1 kU/L
Soybean IgE: <0.1 kU/L
Walnut IgE: <0.1 kU/L
Wheat IgE: <0.1 kU/L

## 2022-11-25 NOTE — Progress Notes (Signed)
Can you please let this patient know that her food allergy testing was negative. Please have her continue with the H1H2 antihistamine ladder and consider Xolair injections for control of urticaria. Thank you

## 2022-11-30 ENCOUNTER — Telehealth: Payer: Self-pay

## 2022-11-30 ENCOUNTER — Ambulatory Visit
Admission: EM | Admit: 2022-11-30 | Discharge: 2022-11-30 | Disposition: A | Discharge status: Home / Self Care | Payer: Medicaid Other | Attending: Family Medicine | Admitting: Family Medicine

## 2022-11-30 DIAGNOSIS — R059 Cough, unspecified: Secondary | ICD-10-CM | POA: Diagnosis present

## 2022-11-30 DIAGNOSIS — J069 Acute upper respiratory infection, unspecified: Secondary | ICD-10-CM | POA: Insufficient documentation

## 2022-11-30 DIAGNOSIS — Z1152 Encounter for screening for COVID-19: Secondary | ICD-10-CM | POA: Diagnosis not present

## 2022-11-30 DIAGNOSIS — R21 Rash and other nonspecific skin eruption: Secondary | ICD-10-CM | POA: Diagnosis not present

## 2022-11-30 LAB — POCT RAPID STREP A (OFFICE): Rapid Strep A Screen: NEGATIVE

## 2022-11-30 MED ORDER — FLUTICASONE PROPIONATE 50 MCG/ACT NA SUSP: 1.0000 | Freq: Two times a day (BID) | NASAL | 2 refills | Status: AC

## 2022-11-30 MED ORDER — HYDROCORTISONE 1 % EX OINT: 1.0000 | TOPICAL_OINTMENT | Freq: Two times a day (BID) | CUTANEOUS | 0 refills | Status: AC

## 2022-11-30 MED ORDER — PROMETHAZINE-DM 6.25-15 MG/5ML PO SYRP: 5.0000 mL | ORAL_SOLUTION | Freq: Four times a day (QID) | ORAL | 0 refills | Status: AC | PRN

## 2022-11-30 NOTE — Telephone Encounter (Signed)
Pt called and stated she was not able to get her medication that was prescribed to her due to financial reasons. I called the pt back and informed her per provider that she can use dayquil, nyquil, or delsym. Pt verbally said she understood what she could use as a substitution.

## 2022-11-30 NOTE — ED Triage Notes (Signed)
Pt reports she has a sore throat, nasal congestion with drainage and her skin has a rash on it across her cheek x 3 days. Pt has an exposure to strep throat. Pt has taken tylenol but no relief.

## 2022-11-30 NOTE — ED Provider Notes (Addendum)
[facility]    CSN: [id number] Arrival date & time: 11/30/22  1148      History   Chief Complaint No chief complaint on file.   HPI Brianna Patel is a 22 y.o. female.   Patient presenting today with 3-day history of sore throat, nasal congestion, cough and also an itchy dry rash across her face.  Denies fever, chills but has had some bodyaches.  No chest pain, shortness of breath, abdominal pain, nausea vomiting or diarrhea.  Taking Tylenol last night with no relief.  Otherwise not tried anything over-the-counter for symptoms.  Has a history of seasonal allergies not currently on anything for this.    Past Medical History:  Diagnosis Date   Closed lumbar vertebral fracture (HCC)    Urticaria     Patient Active Problem List   Diagnosis Date Noted   Chronic rhinitis 11/20/2022   Allergic reaction 06/03/2022    Past Surgical History:  Procedure Laterality Date   INDUCED ABORTION      OB History     Gravida  1   Para      Term      Preterm      AB  1   Living         SAB      IAB      Ectopic      Multiple      Live Births               Home Medications    Prior to Admission medications   Medication Sig Start Date End Date Taking? Authorizing Provider  hydrocortisone 1 % ointment Apply 1 Application topically 2 (two) times daily. 11/30/22  Yes Volney American, PA-C  promethazine-dextromethorphan (PROMETHAZINE-DM) 6.25-15 MG/5ML syrup Take 5 mLs by mouth 4 (four) times daily as needed. 11/30/22  Yes Volney American, PA-C  cetirizine (ZYRTEC) 10 MG tablet Take 1 tablet (10 mg total) by mouth 2 (two) times daily. 11/20/22   Dara Hoyer, FNP  desonide (DESOWEN) 0.05 % ointment Apply 1 Application topically 2 (two) times daily. 11/20/22   Dara Hoyer, FNP  famotidine (PEPCID) 10 MG tablet Take 1 tablet (10 mg total) by mouth 2 (two) times daily. 11/20/22   Dara Hoyer, FNP  fluticasone (FLONASE) 50 MCG/ACT nasal spray Place  1 spray into both nostrils 2 (two) times daily. 11/30/22   Volney American, PA-C  ondansetron (ZOFRAN) 4 MG tablet Take 4 mg by mouth every 8 (eight) hours as needed. 08/29/22   [provider]  triamcinolone (KENALOG) 0.025 % ointment Apply 1 Application topically 2 (two) times daily. 11/20/22   Ambs, Kathrine Cords, FNP  Marilu Favre 150-35 MCG/24HR transdermal patch 1 patch once a week. Patient not taking: Reported on 11/20/2022 11/03/21   [provider]    Family History Family History  Problem Relation Age of Onset   Eczema Mother    Eczema Sister    Allergic rhinitis Neg Hx    Asthma Neg Hx    Urticaria Neg Hx     Social History Social History   Tobacco Use   Smoking status: Never   Smokeless tobacco: Never  Vaping Use   Vaping Use: Never used  Substance Use Topics   Alcohol use: Yes    Comment: occasional   Drug use: Not Currently    Types: Marijuana    Comment: last used "years ago"     Allergies   Patient  has no known allergies.   Review of Systems Review of Systems PER HPI  Physical Exam Triage Vital Signs ED Triage Vitals  Enc Vitals Group     BP 11/30/22 1313 116/79     Pulse Rate 11/30/22 1313 80     Resp 11/30/22 1313 20     Temp 11/30/22 1313 98 F (36.7 C)     Temp Source 11/30/22 1313 Oral     SpO2 11/30/22 1313 98 %     Weight --      Height --      Head Circumference --      Peak Flow --      Pain Score 11/30/22 1315 7     Pain Loc --      Pain Edu? --      Excl. in GC? --    No data found.  Updated Vital Signs BP 116/79 (BP Location: Right Arm)   Pulse 80   Temp 98 F (36.7 C) (Oral)   Resp 20   LMP 11/14/2022   SpO2 98%   Visual Acuity Right Eye Distance:   Left Eye Distance:   Bilateral Distance:    Right Eye Near:   Left Eye Near:    Bilateral Near:     Physical Exam Vitals and nursing note reviewed.  Constitutional:      Appearance: Normal appearance.  HENT:     Head: Atraumatic.     Right Ear:  Tympanic membrane and external ear normal.     Left Ear: Tympanic membrane and external ear normal.     Nose: Rhinorrhea present.     Mouth/Throat:     Mouth: Mucous membranes are moist.     Pharynx: Posterior oropharyngeal erythema present.  Eyes:     Extraocular Movements: Extraocular movements intact.     Conjunctiva/sclera: Conjunctivae normal.  Cardiovascular:     Rate and Rhythm: Normal rate and regular rhythm.     Heart sounds: Normal heart sounds.  Pulmonary:     Effort: Pulmonary effort is normal.     Breath sounds: Normal breath sounds. No wheezing or rales.  Musculoskeletal:        General: Normal range of motion.     Cervical back: Normal range of motion and neck supple.  Skin:    General: Skin is warm and dry.     Comments: Dry, peeling areas to bilateral cheeks  Neurological:     Mental Status: She is alert and oriented to person, place, and time.  Psychiatric:        Mood and Affect: Mood normal.        Thought Content: Thought content normal.    UC Treatments / Results  Labs (all labs ordered are listed, but only abnormal results are displayed) Labs Reviewed  SARS CORONAVIRUS 2 (TAT 6-24 HRS)  POCT RAPID STREP A (OFFICE)    EKG   Radiology No results found.  Procedures Procedures (including critical care time)  Medications Ordered in UC Medications - No data to display  Initial Impression / Assessment and Plan / UC Course  I have reviewed the triage vital signs and the nursing notes.  Pertinent labs & imaging results that were available during my care of the patient were reviewed by me and considered in my medical decision making (see chart for details).     Suspect viral illness, rapid strep negative, COVID testing pending.  Treat with Flonase, Phenergan DM, supportive over-the-counter medications and home care.  Suspect  eczema type rash on the face, hydrocortisone ointment, good moisturization and avoidance of irritating products.  Return for  worsening symptoms.  Final Clinical Impressions(s) / UC Diagnoses   Final diagnoses:  Viral URI with cough  Rash     Discharge Instructions      Your strep test today was negative.  I suspect you have a viral illness.  Your COVID test should be available in the morning, take over-the-counter cold and congestion medications in addition to what I have prescribed.  Ibuprofen and Tylenol for the body aches.  Stay hydrated, get lots of rest    ED Prescriptions     Medication Sig Dispense Auth. Provider   fluticasone (FLONASE) 50 MCG/ACT nasal spray Place 1 spray into both nostrils 2 (two) times daily. Bernice, [place]   promethazine-dextromethorphan (PROMETHAZINE-DM) 6.25-15 MG/5ML syrup Take 5 mLs by mouth 4 (four) times daily as needed. 100 mL Volney American, [place]   hydrocortisone 1 % ointment Apply 1 Application topically 2 (two) times daily. 60 g Volney American, [place]      PDMP not reviewed this encounter.   Volney American, [place] 11/30/22 Kimberly, [place], [place] 11/30/22 1345

## 2022-11-30 NOTE — Discharge Instructions (Signed)
Your strep test today was negative.  I suspect you have a viral illness.  Your COVID test should be available in the morning, take over-the-counter cold and congestion medications in addition to what I have prescribed.  Ibuprofen and Tylenol for the body aches.  Stay hydrated, get lots of rest

## 2022-12-01 LAB — SARS CORONAVIRUS 2 (TAT 6-24 HRS): SARS Coronavirus 2: NEGATIVE

## 2023-01-24 ENCOUNTER — Ambulatory Visit: Payer: Medicaid Other | Admitting: Family Medicine

## 2023-01-24 NOTE — Progress Notes (Deleted)
   [address], [address] [place] [address] Dept: [phone]  FOLLOW UP NOTE  Patient ID: Brianna Patel, female    DOB: Aug 14, 2001  Age: 22 y.o. MRN: [id number] Date of Office Visit: 01/24/2023  Assessment  Chief Complaint: No chief complaint on file.  HPI Brianna Patel is a 22 year old female who presents to the clinic for follow-up visit.  She was last seen in this clinic on 11/20/2022 by Gareth Morgan, FNP, for chronic urticaria, chronic rhinitis, and possible food allergies/'s intolerance with negative food testing via lab.   Drug Allergies:  No Known Allergies  Physical Exam: There were no vitals taken for this visit.   Physical Exam  Diagnostics:    Assessment and Plan: No diagnosis found.  No orders of the defined types were placed in this encounter.   There are no Patient Instructions on file for this visit.  No follow-ups on file.    Thank you for the opportunity to care for this patient.  Please do not hesitate to contact me with questions.  Gareth Morgan, FNP [facility] [place]

## 2023-06-30 ENCOUNTER — Ambulatory Visit
Admission: EM | Admit: 2023-06-30 | Discharge: 2023-06-30 | Disposition: A | Discharge status: Home / Self Care | Payer: Medicaid Other | Attending: Nurse Practitioner | Admitting: Nurse Practitioner

## 2023-06-30 DIAGNOSIS — Z113 Encounter for screening for infections with a predominantly sexual mode of transmission: Secondary | ICD-10-CM | POA: Diagnosis not present

## 2023-06-30 DIAGNOSIS — R399 Unspecified symptoms and signs involving the genitourinary system: Secondary | ICD-10-CM | POA: Insufficient documentation

## 2023-06-30 DIAGNOSIS — N898 Other specified noninflammatory disorders of vagina: Secondary | ICD-10-CM | POA: Insufficient documentation

## 2023-06-30 LAB — POCT URINALYSIS DIP (MANUAL ENTRY)
Bilirubin, UA: NEGATIVE
Blood, UA: NEGATIVE
Glucose, UA: NEGATIVE mg/dL
Ketones, POC UA: NEGATIVE mg/dL
Leukocytes, UA: NEGATIVE
Nitrite, UA: NEGATIVE
Protein Ur, POC: 100 mg/dL — AB
Spec Grav, UA: >=1.03 — AB (ref 1.010–1.025)
Urobilinogen, UA: 1 E.U./dL
pH, UA: 6 (ref 5.0–8.0)

## 2023-06-30 LAB — POCT URINE PREGNANCY: Preg Test, Ur: NEGATIVE

## 2023-06-30 MED ORDER — FLUCONAZOLE 150 MG PO TABS: 150.0000 mg | ORAL_TABLET | Freq: Once | ORAL | 0 refills | Status: AC

## 2023-06-30 NOTE — ED Provider Notes (Signed)
[facility]    CSN: [id number] Arrival date & time: 06/30/23  1353      History   Chief Complaint Chief Complaint  Patient presents with   Urinary Tract Infection    HPI Brianna Patel is a 22 y.o. female.   The history is provided by the patient.   The patient presents for complaints of vaginal itching, vaginal discharge, vaginal odor, low back pain, lower abdominal cramping and urinary frequency.  Symptoms have been present for the past several days.  Patient denies fever, chills, hematuria, flank pain, decreased urine stream, nausea, vomiting, or diarrhea.  Patient reports 2 female partners in the past 90 days.  Last menstrual cycle 06/23/2023.  Patient with prior history of chlamydia.  Past Medical History:  Diagnosis Date   Closed lumbar vertebral fracture (HCC)    Urticaria     Patient Active Problem List   Diagnosis Date Noted   Chronic rhinitis 11/20/2022   Allergic reaction 06/03/2022    Past Surgical History:  Procedure Laterality Date   INDUCED ABORTION      OB History     Gravida  1   Para      Term      Preterm      AB  1   Living         SAB      IAB      Ectopic      Multiple      Live Births               Home Medications    Prior to Admission medications   Medication Sig Start Date End Date Taking? Authorizing Provider  fluconazole (DIFLUCAN) 150 MG tablet Take 1 tablet (150 mg total) by mouth once for 1 dose. 06/30/23 06/30/23 Yes Charlane Westry-Warren, Sadie Haber, NP  cetirizine (ZYRTEC) 10 MG tablet Take 1 tablet (10 mg total) by mouth 2 (two) times daily. 11/20/22   Hetty Blend, FNP  desonide (DESOWEN) 0.05 % ointment Apply 1 Application topically 2 (two) times daily. 11/20/22   Hetty Blend, FNP  famotidine (PEPCID) 10 MG tablet Take 1 tablet (10 mg total) by mouth 2 (two) times daily. 11/20/22   Hetty Blend, FNP  fluticasone (FLONASE) 50 MCG/ACT nasal spray Place 1 spray into both nostrils 2 (two) times daily. 11/30/22    Particia Nearing, PA-C  hydrocortisone 1 % ointment Apply 1 Application topically 2 (two) times daily. 11/30/22   Particia Nearing, PA-C  ondansetron (ZOFRAN) 4 MG tablet Take 4 mg by mouth every 8 (eight) hours as needed. 08/29/22   [provider]  promethazine-dextromethorphan (PROMETHAZINE-DM) 6.25-15 MG/5ML syrup Take 5 mLs by mouth 4 (four) times daily as needed. 11/30/22   Particia Nearing, PA-C  triamcinolone (KENALOG) 0.025 % ointment Apply 1 Application topically 2 (two) times daily. 11/20/22   Ambs, Norvel Richards, FNP  Burr Medico 150-35 MCG/24HR transdermal patch 1 patch once a week. Patient not taking: Reported on 11/20/2022 11/03/21   [provider]    Family History Family History  Problem Relation Age of Onset   Eczema Mother    Eczema Sister    Allergic rhinitis Neg Hx    Asthma Neg Hx    Urticaria Neg Hx     Social History Social History   Tobacco Use   Smoking status: Never   Smokeless tobacco: Never  Vaping Use   Vaping status: Never Used  Substance Use Topics  Alcohol use: Yes    Comment: occasional   Drug use: Not Currently    Types: Marijuana    Comment: last used "years ago"     Allergies   Patient has no known allergies.   Review of Systems Review of Systems Per HPI  Physical Exam Triage Vital Signs ED Triage Vitals [06/30/23 1533]  Encounter Vitals Group     BP 108/70     Systolic BP Percentile      Diastolic BP Percentile      Pulse Rate 84     Resp 16     Temp 98.2 F (36.8 C)     Temp Source Oral     SpO2 98 %     Weight      Height      Head Circumference      Peak Flow      Pain Score 0     Pain Loc      Pain Education      Exclude from Growth Chart    No data found.  Updated Vital Signs BP 108/70 (BP Location: Right Arm)   Pulse 84   Temp 98.2 F (36.8 C) (Oral)   Resp 16   LMP 06/23/2023 (Exact Date)   SpO2 98%   Visual Acuity Right Eye Distance:   Left Eye Distance:   Bilateral  Distance:    Right Eye Near:   Left Eye Near:    Bilateral Near:     Physical Exam Vitals and nursing note reviewed.  Constitutional:      Appearance: Normal appearance. She is not toxic-appearing.  HENT:     Head: Normocephalic.  Eyes:     Extraocular Movements: Extraocular movements intact.     Pupils: Pupils are equal, round, and reactive to light.  Cardiovascular:     Rate and Rhythm: Normal rate and regular rhythm.     Pulses: Normal pulses.     Heart sounds: Normal heart sounds.  Pulmonary:     Effort: Pulmonary effort is normal.     Breath sounds: Normal breath sounds.  Abdominal:     General: Bowel sounds are normal.     Palpations: Abdomen is soft.     Tenderness: There is no abdominal tenderness. There is no right CVA tenderness or left CVA tenderness.  Musculoskeletal:     Cervical back: Normal range of motion.  Lymphadenopathy:     Cervical: No cervical adenopathy.  Skin:    General: Skin is warm and dry.  Neurological:     General: No focal deficit present.     Mental Status: She is alert and oriented to person, place, and time.  Psychiatric:        Mood and Affect: Mood normal.        Behavior: Behavior normal.      UC Treatments / Results  Labs (all labs ordered are listed, but only abnormal results are displayed) Labs Reviewed  POCT URINALYSIS DIP (MANUAL ENTRY) - Abnormal; Notable for the following components:      Result Value   Clarity, UA hazy (*)    Spec Grav, UA >=1.030 (*)    Protein Ur, POC =100 (*)    All other components within normal limits  URINE CULTURE  POCT URINE PREGNANCY  CERVICOVAGINAL ANCILLARY ONLY    EKG   Radiology No results found.  Procedures Procedures (including critical care time)  Medications Ordered in UC Medications - No data to display  Initial Impression /  Assessment and Plan / UC Course  I have reviewed the triage vital signs and the nursing notes.  Pertinent labs & imaging results that were  available during my care of the patient were reviewed by me and considered in my medical decision making (see chart for details).  The patient is well-appearing, she is in no acute distress, vital signs are stable.  Cytology swab and urine culture are pending.  Urinalysis did not indicate an obvious urinary tract infection.  In the interim, will treat patient's vaginal itching with fluconazole 150 mg tablet.  Supportive care recommendations were provided and discussed with the patient to include refraining from sexual intercourse until she has received her test results and condom use with every sexual encounter.  Patient was advised that if her STI results are positive, to notify her partners.  Patient is in agreement with this plan of care and verbalizes understanding.  All questions were answered.  Patient stable for discharge.  Final Clinical Impressions(s) / UC Diagnoses   Final diagnoses:  Vaginal itching  Screening examination for sexually transmitted disease  UTI symptoms     Discharge Instructions      The urinalysis did not indicate an obvious urinary tract infection.  A urine culture and cytology swab are pending.  You will be contacted if the pending test results are abnormal. Take medication as prescribed. Condom use with each sexual encounter. If your STI results are positive, please notify all partners. Refrain from sexual intercourse until your test results have been received.  If you are treated for an STI, you will need to refrain from sexual intercourse for an additional 7 days after you have completed treatment. Follow-up as needed.     ED Prescriptions     Medication Sig Dispense Auth. Provider   fluconazole (DIFLUCAN) 150 MG tablet Take 1 tablet (150 mg total) by mouth once for 1 dose. 1 tablet Tavi Gaughran-Warren, Sadie Haber, NP      PDMP not reviewed this encounter.   Abran Cantor, NP 06/30/23 1607

## 2023-06-30 NOTE — ED Triage Notes (Signed)
Urinary frequency, burning with urination, lower abdominal cramping and back pain that started 2 days ago with clear think discharge and itching. Tried boric acid and cranberry pills with no relief. Pt would like to have STD testing.

## 2023-06-30 NOTE — Discharge Instructions (Signed)
The urinalysis did not indicate an obvious urinary tract infection.  A urine culture and cytology swab are pending.  You will be contacted if the pending test results are abnormal. Take medication as prescribed. Condom use with each sexual encounter. If your STI results are positive, please notify all partners. Refrain from sexual intercourse until your test results have been received.  If you are treated for an STI, you will need to refrain from sexual intercourse for an additional 7 days after you have completed treatment. Follow-up as needed.

## 2023-07-02 ENCOUNTER — Telehealth (HOSPITAL_COMMUNITY): Payer: Self-pay | Admitting: Emergency Medicine

## 2023-07-02 MED ORDER — METRONIDAZOLE 500 MG PO TABS: 500.0000 mg | ORAL_TABLET | Freq: Two times a day (BID) | ORAL | 0 refills | Status: AC

## 2023-07-02 MED ORDER — DOXYCYCLINE HYCLATE 100 MG PO CAPS: 100.0000 mg | ORAL_CAPSULE | Freq: Two times a day (BID) | ORAL | 0 refills | Status: AC

## 2023-07-10 ENCOUNTER — Telehealth: Payer: Self-pay | Admitting: Emergency Medicine

## 2023-07-10 MED ORDER — FLUCONAZOLE 150 MG PO TABS: 150.0000 mg | ORAL_TABLET | Freq: Every day | ORAL | 0 refills | Status: AC

## 2023-07-10 NOTE — Telephone Encounter (Signed)
Patient called and states she took diflucan prior to taking antibiotics and now has a yeast infection after taking flagyl and doxycyline.  Diflucan sent in to [facility] for patient

## 2024-02-27 ENCOUNTER — Telehealth: Payer: Self-pay | Admitting: *Deleted

## 2024-02-27 DIAGNOSIS — Z3490 Encounter for supervision of normal pregnancy, unspecified, unspecified trimester: Secondary | ICD-10-CM

## 2024-02-27 NOTE — Telephone Encounter (Signed)
 Returned patient's call.  Went to ER for cramping and sore throat.  HCG 66 and US done but nothing was seen on the U/S. Was told she needed repeat labs and another ultrasound this week.  Informed patient if she is only 4 weeks, nothing would be seen on the ultrasound.  Will repeat labs and will be in touch on if she needs additional bloodwork or u/s as scheduled.  Pt verbalized understanding with no further questions.

## 2024-02-27 NOTE — Addendum Note (Signed)
 Addended by: Moss Mc on: 02/27/2024 12:06 PM   Modules accepted: Orders
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
## 2024-03-18 ENCOUNTER — Other Ambulatory Visit: Payer: Self-pay | Admitting: Obstetrics & Gynecology

## 2024-03-18 DIAGNOSIS — O3680X0 Pregnancy with inconclusive fetal viability, not applicable or unspecified: Secondary | ICD-10-CM

## 2024-03-19 ENCOUNTER — Other Ambulatory Visit: Admitting: Radiology

## 2024-05-01 ENCOUNTER — Emergency Department (HOSPITAL_COMMUNITY)
Admission: EM | Admit: 2024-05-01 | Discharge: 2024-05-01 | Disposition: A | Discharge status: Home / Self Care | Source: Home / Self Care | Attending: Emergency Medicine | Admitting: Emergency Medicine

## 2024-05-01 ENCOUNTER — Encounter (HOSPITAL_COMMUNITY): Payer: Self-pay

## 2024-05-01 ENCOUNTER — Emergency Department (HOSPITAL_COMMUNITY)

## 2024-05-01 ENCOUNTER — Other Ambulatory Visit: Payer: Self-pay

## 2024-05-01 ENCOUNTER — Emergency Department (HOSPITAL_COMMUNITY)
Admission: EM | Admit: 2024-05-01 | Discharge: 2024-05-01 | Disposition: A | Discharge status: Home / Self Care | Attending: Emergency Medicine | Admitting: Emergency Medicine

## 2024-05-01 DIAGNOSIS — T6591XA Toxic effect of unspecified substance, accidental (unintentional), initial encounter: Secondary | ICD-10-CM | POA: Insufficient documentation

## 2024-05-01 DIAGNOSIS — S20312A Abrasion of left front wall of thorax, initial encounter: Secondary | ICD-10-CM | POA: Diagnosis not present

## 2024-05-01 DIAGNOSIS — Y92410 Unspecified street and highway as the place of occurrence of the external cause: Secondary | ICD-10-CM | POA: Insufficient documentation

## 2024-05-01 DIAGNOSIS — S0083XA Contusion of other part of head, initial encounter: Secondary | ICD-10-CM | POA: Insufficient documentation

## 2024-05-01 DIAGNOSIS — T23561A Corrosion of first degree of back of right hand, initial encounter: Secondary | ICD-10-CM | POA: Insufficient documentation

## 2024-05-01 DIAGNOSIS — S161XXA Strain of muscle, fascia and tendon at neck level, initial encounter: Secondary | ICD-10-CM | POA: Diagnosis not present

## 2024-05-01 DIAGNOSIS — M542 Cervicalgia: Secondary | ICD-10-CM | POA: Diagnosis present

## 2024-05-01 DIAGNOSIS — S0993XA Unspecified injury of face, initial encounter: Secondary | ICD-10-CM | POA: Diagnosis present

## 2024-05-01 MED ORDER — IBUPROFEN 800 MG PO TABS
800.0000 mg | ORAL_TABLET | Freq: Once | ORAL | Status: AC
  Administered 2024-05-01: 800 mg via ORAL
  Filled 2024-05-01: qty 1

## 2024-05-01 MED ORDER — BACITRACIN ZINC 500 UNIT/GM EX OINT: TOPICAL_OINTMENT | Freq: Two times a day (BID) | CUTANEOUS | Status: DC

## 2024-05-01 MED ORDER — NAPROXEN 500 MG PO TABS: 500.0000 mg | ORAL_TABLET | Freq: Two times a day (BID) | ORAL | 0 refills | Status: AC

## 2024-05-01 MED ORDER — METHOCARBAMOL 500 MG PO TABS: 500.0000 mg | ORAL_TABLET | Freq: Two times a day (BID) | ORAL | 0 refills | Status: AC | PRN

## 2024-05-01 MED ORDER — METHOCARBAMOL 500 MG PO TABS: 500.0000 mg | ORAL_TABLET | Freq: Two times a day (BID) | ORAL | 0 refills | Status: DC | PRN

## 2024-05-01 MED ORDER — NAPROXEN 500 MG PO TABS: 500.0000 mg | ORAL_TABLET | Freq: Two times a day (BID) | ORAL | 0 refills | Status: DC
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
## 2024-05-01 NOTE — Discharge Instructions (Addendum)
 Continue taking your naproxen  and follow-up as instructed by Dr. Annabell Key earlier today
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
## 2024-05-02 NOTE — ED Notes (Signed)
 Pt needed school and work notes printed.
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
